# Patient Record
Sex: Female | Born: 1935
Health system: Southern US, Community
[De-identification: ages and names within clinical notes are randomized; demographics above are authoritative.]

## PROBLEM LIST (undated history)

## (undated) DIAGNOSIS — F429 Obsessive-compulsive disorder, unspecified: Secondary | ICD-10-CM

## (undated) DIAGNOSIS — E039 Hypothyroidism, unspecified: Secondary | ICD-10-CM

## (undated) DIAGNOSIS — R110 Nausea: Secondary | ICD-10-CM

## (undated) DIAGNOSIS — B029 Zoster without complications: Secondary | ICD-10-CM

## (undated) DIAGNOSIS — E079 Disorder of thyroid, unspecified: Secondary | ICD-10-CM

## (undated) DIAGNOSIS — G20 Parkinson's disease: Secondary | ICD-10-CM

## (undated) DIAGNOSIS — G20A1 Parkinson's disease without dyskinesia, without mention of fluctuations: Secondary | ICD-10-CM

## (undated) DIAGNOSIS — K8020 Calculus of gallbladder without cholecystitis without obstruction: Secondary | ICD-10-CM

## (undated) HISTORY — PX: OTHER SURGICAL HISTORY: SHX169

## (undated) HISTORY — DX: Disorder of thyroid, unspecified: E07.9

## (undated) HISTORY — DX: Zoster without complications: B02.9

## (undated) HISTORY — PX: THYROIDECTOMY: SHX17

## (undated) HISTORY — PX: EYE SURGERY: SHX253

---
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
## 1999-03-12 ENCOUNTER — Encounter: Payer: Self-pay | Admitting: Family Medicine

## 1999-03-12 ENCOUNTER — Encounter: Admission: RE | Admit: 1999-03-12 | Discharge: 1999-03-12 | Payer: Self-pay | Admitting: Family Medicine

## 2000-05-09 ENCOUNTER — Encounter: Admission: RE | Admit: 2000-05-09 | Discharge: 2000-05-09 | Payer: Self-pay | Admitting: Family Medicine

## 2000-05-09 ENCOUNTER — Encounter: Payer: Self-pay | Admitting: Family Medicine

## 2000-05-20 ENCOUNTER — Encounter: Admission: RE | Admit: 2000-05-20 | Discharge: 2000-05-20 | Payer: Self-pay | Admitting: Family Medicine

## 2000-05-20 ENCOUNTER — Encounter: Payer: Self-pay | Admitting: Family Medicine

## 2005-01-16 ENCOUNTER — Emergency Department (HOSPITAL_COMMUNITY): Admission: EM | Admit: 2005-01-16 | Discharge: 2005-01-16 | Payer: Self-pay | Admitting: Emergency Medicine

## 2008-03-09 ENCOUNTER — Ambulatory Visit: Payer: Self-pay | Admitting: Diagnostic Radiology

## 2008-03-09 ENCOUNTER — Encounter: Payer: Self-pay | Admitting: Emergency Medicine

## 2008-03-09 ENCOUNTER — Inpatient Hospital Stay (HOSPITAL_COMMUNITY): Admission: AD | Admit: 2008-03-09 | Discharge: 2008-03-10 | Payer: Self-pay | Admitting: Internal Medicine

## 2008-03-10 ENCOUNTER — Encounter (INDEPENDENT_AMBULATORY_CARE_PROVIDER_SITE_OTHER): Payer: Self-pay | Admitting: Internal Medicine

## 2008-03-10 ENCOUNTER — Ambulatory Visit: Payer: Self-pay | Admitting: Surgery

## 2009-07-26 ENCOUNTER — Encounter: Admission: RE | Admit: 2009-07-26 | Discharge: 2009-07-26 | Payer: Self-pay | Admitting: Family Medicine

## 2010-04-19 LAB — BASIC METABOLIC PANEL
CO2: 28 mEq/L (ref 19–32)
Calcium: 9.1 mg/dL (ref 8.4–10.5)
Creatinine, Ser: 0.7 mg/dL (ref 0.4–1.2)
GFR calc Af Amer: >60 mL/min (ref >60)

## 2010-04-19 LAB — CBC
HCT: 39.1 % (ref 36.0–46.0)
Hemoglobin: 13.2 g/dL (ref 12.0–15.0)
MCHC: 32.6 g/dL (ref 30.0–36.0)
MCHC: 33.7 g/dL (ref 30.0–36.0)
MCV: 91.7 fL (ref 78.0–100.0)
RBC: 4.26 MIL/uL (ref 3.87–5.11)
RBC: 4.62 MIL/uL (ref 3.87–5.11)
RDW: 12.7 % (ref 11.5–15.5)

## 2010-04-19 LAB — POCT CARDIAC MARKERS
CKMB, poc: <1 ng/mL — ABNORMAL LOW (ref 1.0–8.0)
Myoglobin, poc: 50.7 ng/mL (ref 12–200)

## 2010-04-19 LAB — COMPREHENSIVE METABOLIC PANEL
CO2: 25 mEq/L (ref 19–32)
Calcium: 8.7 mg/dL (ref 8.4–10.5)
Creatinine, Ser: 0.81 mg/dL (ref 0.4–1.2)
GFR calc non Af Amer: >60 mL/min (ref >60)
Glucose, Bld: 98 mg/dL (ref 70–99)
Total Bilirubin: 0.8 mg/dL (ref 0.3–1.2)

## 2010-04-19 LAB — DIFFERENTIAL
Basophils Absolute: 0 10*3/uL (ref 0.0–0.1)
Basophils Relative: 1 % (ref 0–1)
Monocytes Relative: 8 % (ref 3–12)
Neutro Abs: 4.2 10*3/uL (ref 1.7–7.7)
Neutrophils Relative %: 49 % (ref 43–77)

## 2010-04-19 LAB — HEMOGLOBIN A1C: Hgb A1c MFr Bld: 5.9 % (ref 4.6–6.1)

## 2010-04-19 LAB — HOMOCYSTEINE: Homocysteine: 8.4 umol/L (ref 4.0–15.4)

## 2010-04-19 LAB — CARDIAC PANEL(CRET KIN+CKTOT+MB+TROPI): CK, MB: 1.1 ng/mL (ref 0.3–4.0)

## 2010-04-19 LAB — TSH: TSH: 2.131 u[IU]/mL (ref 0.350–4.500)

## 2010-05-22 NOTE — Consult Note (Signed)
NAMEMARCEE, Reed                ACCOUNT NO.:  [id number]   MEDICAL RECORD NO.:  [id number]          PATIENT TYPE:  INP   LOCATION:  1442                         FACILITY:  [facility]   PHYSICIAN:  Deanna Artis. Hickling, M.D.DATE OF BIRTH:  Apr 23, 1926   DATE OF CONSULTATION:  03/10/2008  DATE OF DISCHARGE:                                 CONSULTATION   Courtney Reed is a 77 year old woman who was seen in consultation at the  request of Dr. Therisa Doyne a hospitalist for [facility].  The patient  has a chief complaint of seconds of blurred vision (graining of her  vision), unsteadiness on her feet, and slight right facial numbness.  This literally passed in seconds.  She had gotten up, bent on over to  fill up a bucket of hot water, and was walking to the shower when she  suddenly experienced her symptoms.  The symptoms cleared within seconds  other than the facial numbness which may have persisted a short time  longer.  The patient did not have any nausea, vomiting, headache, nor  did she have any other neurologic symptoms.   The patient was admitted to the hospital for evaluation of a possible  TIA.  Her workup shows evidence of a remote pair of strokes in the right  centrum semiovale.  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  I was asked to see her to  determine the etiology of her neurologic symptoms as well as to give an  opinion as to whether or not further diagnostic or therapeutic  intervention was needed for her aneurysm.   The patient has very few risk factors for stroke.  She has had transient  hypertension in the past.  She does not have diabetes.  I do not know if  she has dyslipidemia, but she has not had problems  with it in the past  and is not on statin drugs.  She has some osteoarthritis of her back and  from time to time has enough pain that she requires 200 mg of Advil to  improve her pain.   REVIEW OF SYSTEMS:  GENERAL:  The patient's appetite is good.  She has  normal sleep cycle and no significant weight gain.  HEAD/NECK:  No  otitis media, pharyngitis, or sinusitis.  PULMONARY:  No asthma,  bronchitis, or pneumonia.  CARDIOVASCULAR:  No palpitations, chest pain,  dyspnea on exertion, or prior myocardial infarction.  Marland Kitchen  GASTROINTESTINAL:  No nausea, vomiting, diarrhea, or constipation.  GENITOURINARY:  No urinary tract infection or hematuria.  MUSCULOSKELETAL:  No fractures or deformities.  She does have  osteoarthritis.  ENDOCRINE:  No diabetes or thyroid disease.  ALLERGY/IMMUNOLOGY:  No known environmental allergies.  NEUROPSYCHIATRIC:  No depression or anxiety.  NEUROLOGIC:  No  dysarthria, dysphagia, tinnitus, syncope, vertigo, weakness, seizures,  or loss  of bowel and bladder control.  A 12 system review is otherwise  negative.   CURRENT MEDICATIONS:  1. Aspirin 81 mg daily.  2. Synthroid.  3. Premarin.  4. Calcium.  5. Multivitamins.   DRUG ALLERGIES:  SULFA.   FAMILY HISTORY:  Positive for stroke in her mother which occurred in her  51s.   SOCIAL HISTORY:  The patient does not use tobacco or alcohol.  She is a  retired [employer] operational officer.  She graduated from high school.  Her husband died in  06/02/2004 after a 3-year illness following stroke.   PHYSICAL EXAMINATION:  GENERAL:  On examination today, this is a well-  developed, well-nourished, pleasant, right-handed woman in no distress.  VITAL SIGNS:  Blood pressure 107/56, resting pulse 67, respirations 18,  temperature 97.8.  HEAD, EYES, EARS, NOSE, AND THROAT:  No signs of infection.  Supple neck  full range of motion.  No cranial or cervical bruits.  LUNGS:  Clear to auscultation.  HEART:  No murmurs.  Pulses normal.  ABDOMEN:   Soft, nontender.  Bowel sounds normal.  EXTREMITIES:  Well-formed without edema, cyanosis, alterations in tone.  NEUROLOGIC:  Mental status:  Awake, alert, attentive, appropriate, no  dysphasia, dyspraxia, names objects, follows commands, conveys thoughts  and feelings.  Cranial nerves:  Round and reactive pupils.  Normal  fundi.  Visual fields full to double simultaneous stimuli.  Extraocular  movements full and conjugate.  Symmetric facial strength.  Midline  tongue and uvula.  Air conduction greater than bone conduction  bilaterally.  Motor examination:  Normal strength, tone, and mass.  Good  fine motor movements.  No pronator drift.  Sensation shows a mild  peripheral polyneuropathy to cold.  She has intact responses.  She has  diminished vibration.  Normal proprioception.  Good stereoagnosis.  Gait  was normal.  She was able to walk on her heels and toes performing  tandem without difficulty.  Romberg is negative.  Deep tendon reflexes  were diminished.  The patient had bilateral flexor plantar responses.   IMPRESSION:  Vertebrobasilar insufficiency 435.1.  I really think that  this was a mild episode of hypotension and hypoperfusion rather than an  embolus or thrombus.  The episode lasted a very short period of time,  too short in my opinion to be a true transient ischemic attack.   Nonetheless, the patient has had 2 white matter infarctions in the right  centrum semiovale.  These are remote.  There are no acute infarctions.  Thus, she needs a workup for treatable causes of stroke.   She has a 2.8 to 3.2 mm aneurysm of the carotid artery just distal to  the ophthalmic takeoff.  The risk of rupture of this is 1% per year  which makes it a condition that should be observed without treatment.   I think it would be worthwhile to perform an MRA of the brain in one  year's time to see if the aneurysm has changed.  If it has, referral to  Neurosurgery would be indicated.  The risks of  coiling this are  relatively small, but they are bigger and more immediate than  conservatively following this for now.   We need to deal with any risk factors that she has for stroke given that  she has had 2 strokes even though I do not believe this time was a  stroke.   I discussed this with Dr. Adela Glimpse and also with the patient.  They are  in agreement.  I appreciate the opportunity to participate in her care.      Deanna Artis. Sharene Skeans, M.D.  Electronically Signed     WHH/MEDQ  D:  03/10/2008  T:  03/10/2008  Job:  [id number]   cc:   L. Lupe Carney, M.D.  Fax: [phone]

## 2010-05-22 NOTE — H&P (Signed)
Courtney Reed, HARADA                ACCOUNT NO.:  [id number]   MEDICAL RECORD NO.:  [id number]          PATIENT TYPE:  INP   LOCATION:  1442                         FACILITY:  [facility]   PHYSICIAN:  Michiel Cowboy, MDDATE OF BIRTH:  1926-12-06   DATE OF ADMISSION:  03/09/2008  DATE OF DISCHARGE:                              HISTORY & PHYSICAL   PRIMARY CARE Ved Martos:  Dr. Lupe Carney.   CHIEF COMPLAINT:  Unstable gait, blurred vision.  The patient is a 77-  year-old female with past medical history of hypothyroidism who was at  her baseline of health up until this morning when she got up to use the  shower she noticed that she was somewhat unsteady on her feet which is a  new thing for her.  She is usually able to ambulate just without any  troubles.  She also notes some blurred vision and slight tingling over  the right side of her face.  No localized loss of strength.  No chest  pain.  The patient has been generally short of breath for quite some  time, and her physician felt that it was secondary to deconditioning.  Otherwise, no fevers no chills.   REVIEW OF SYSTEMS:  Otherwise unremarkable.   PAST MEDICAL HISTORY:  Only significant for hypothyroidism.   SOCIAL HISTORY:  The patient does not smoke or drink.  Lives at home by  herself.   FAMILY HISTORY:  Noncontributory.   ALLERGIES:  SULFA   MEDICATIONS:  1. Aspirin 81 mg p.o. daily.  Of note, the patient did take her daily      aspirin today plus 2 doses once she noticed the symptoms.  2. Multivitamins.  3. Calcium.  4. Synthroid.  5. Premarin twice a day.   She is unsure what dose of her medications.   PHYSICAL EXAMINATION:  VITALS:  Temperature 98.2, blood pressure 167/82,  pulse 77, respirations 18, saturating 99% on room air.  The patient  currently appears to be in no acute distress, much younger than stated  age.  HEAD:  Nontraumatic.  Moist mucous membranes.  LUNGS:  Clear to auscultation  bilaterally.  HEART:  Regular rate and rhythm.  No murmurs, rubs or gallops.  ABDOMEN:  Soft, nontender, nondistended.  EXTREMITIES:  Without clubbing, cyanosis or edema.  The patient does have some ataxia when walks but no loss of strength.  Strength intact bilaterally 5/5.  Reflexes normal.  Cranial nerves II-  XII intact.   LABORATORY DATA:  White blood cell count 8.4, hemoglobin 13.8.  Sodium  142, potassium 4.0, creatinine 0.7.  Cardiac enzymes negative.  CT scan  of the head unremarkable.   EKG showing occasional T-wave flattening but no ischemic changes noted.  No old EKG available.  There is T-wave flattening in lead V2, V3, and  perhaps some aVL.   ASSESSMENT AND PLAN:  This is a 77 year old female with past medical  history of hypothyroidism who was at her baseline of health until this  morning when she developed symptoms consistent with possible TIA;  currently is almost all resolved.  We  will admit to telemetry floor.  Will do an MRI/MRA of the brain.  Obtain 2-D echocardiogram, carotid  Dopplers.  Consider neurology consult tomorrow.   Will also check a fasting lipid panel, homocystine level.  We will avoid  over treatment of high blood pressure while potentially has a TIA but  will give p.r.n. hydralazine for severe hypertension.   History of hypothyroidism:  Will check TSH.  Continue Synthroid.  The  patient thinks it may be 75 mcg per day.  Will try to obtain accurate  current medication list from family.   Prophylaxis:  Protonix plus SCDs.      Michiel Cowboy, MD  Electronically Signed     AVD/MEDQ  D:  03/09/2008  T:  03/09/2008  Job:  [id number]   cc:   L. Lupe Carney, M.D.  Fax: [phone]

## 2010-05-22 NOTE — Discharge Summary (Signed)
Courtney Reed, Courtney Reed                ACCOUNT NO.:  [id number]   MEDICAL RECORD NO.:  [id number]          PATIENT TYPE:  INP   LOCATION:  1442                         [facility]AC[facility]  [facility]   PHYSICIAN:  Michiel Cowboy, MDDATE OF BIRTH:  11-Jul-1926   DATE OF ADMISSION:  03/09/2008  DATE OF DISCHARGE:  03/10/2008                               DISCHARGE SUMMARY   PRIMARY CARE Amarilys Lyles:  Elsworth Soho, M.D.   CONSULTANT:  Deanna Artis. Sharene Skeans, M.D.   DISCHARGE DIAGNOSES:  1. Transient neurological change thought to be likely secondary to      transient episode of hypertension/hypoperfusion rather than true      transient ischemic attack.  2. Newly diagnosed 3-mm by 3.2-mm aneurysm of carotid artery just      distal to a prominent takeoff.  3. MRI findings consistent with old cerebrovascular accidents in the      past.   The patient is a 77 year old female who presented with chief complaint  of transient ataxia and slight tingling of the right side of her face  which was very brief and resolved by the time she was transferred from  [facility] to [facility], but the patient was still being evaluated for  possible transient TIA, so for possible TIA Dr. Sharene Skeans was called to  see her in consult.  Per his recommendations, he thought that this was  not necessarily a true TIA but probably a hypoperfusion episode due to  possible transient hypertension since this was a very brief event.  Per  Dr. Sharene Skeans, will continue aspirin.  She was also noted to have, on her  MRI, a 3-mm x 3.2-mm aneurysm of carotid artery just distal to a  prominent takeoff.  Per Dr. Sharene Skeans, the risk of this rupturing is  about 1% per year.  They discussed this with the patient and came to  conclusion for now will be managed medically conservatively and just  with observation.  She will probably need a repeat MRA within a year and  I asked her to see Dr. Sharene Skeans within a year for follow-up.  If there  is a growth  noted, then possibly referral to neurosurgery would be  warranted.   History of hypothyroidism, continue Synthroid.   LABS:  The patient's cardiac markers were unremarkable.  TSH was  unremarkable.  Her carotid dopplers, preliminary study, did not show any  evidence of significant obstruction.  A 2-D echocardiogram was done and  apparently not back.  Homocysteine level was within normal limits.  Fasting lipid panel is still currently pending.  Hemoglobin A1c was 5.9.   The patient wished to be discharged to home and will do so with close  follow-up with her primary care Imagene Boss and continued modification of  her risk factors.  She was transiently hypertensive up to 167 while  here, but her blood pressure has come down without any intervention and  remained low 100 throughout the hospitalization; at this point, no  antihypertensive initiated.   DISCHARGE MEDICATIONS:  1. Continue home aspirin 81 mg p.o. daily.  2. Continue multivitamin.  3.  Continue calcium and Synthroid at 75 mcg p.o. daily.   FOLLOWUP:  Patient is to follow up with her primary care Quaniya Damas, Dr.  Clovis Riley, in 1-2 weeks and with Dr. Sharene Skeans in a year.  Patient to have  a repeat MRA of her brain in a year.      Michiel Cowboy, MD  Electronically Signed     AVD/MEDQ  D:  03/10/2008  T:  03/10/2008  Job:  [id number]   cc:   L. Lupe Carney, M.D.  Fax: [phone]   Deanna Artis. Sharene Skeans, M.D.  Fax: [phone]

## 2011-02-05 DIAGNOSIS — H02839 Dermatochalasis of unspecified eye, unspecified eyelid: Secondary | ICD-10-CM | POA: Diagnosis not present

## 2011-02-19 DIAGNOSIS — M159 Polyosteoarthritis, unspecified: Secondary | ICD-10-CM | POA: Diagnosis not present

## 2011-02-19 DIAGNOSIS — Z79899 Other long term (current) drug therapy: Secondary | ICD-10-CM | POA: Diagnosis not present

## 2011-02-19 DIAGNOSIS — N951 Menopausal and female climacteric states: Secondary | ICD-10-CM | POA: Diagnosis not present

## 2011-02-19 DIAGNOSIS — G20 Parkinson's disease: Secondary | ICD-10-CM | POA: Diagnosis not present

## 2011-02-19 DIAGNOSIS — E039 Hypothyroidism, unspecified: Secondary | ICD-10-CM | POA: Diagnosis not present

## 2011-02-19 DIAGNOSIS — I671 Cerebral aneurysm, nonruptured: Secondary | ICD-10-CM | POA: Diagnosis not present

## 2011-02-22 DIAGNOSIS — D485 Neoplasm of uncertain behavior of skin: Secondary | ICD-10-CM | POA: Diagnosis not present

## 2011-02-22 DIAGNOSIS — R209 Unspecified disturbances of skin sensation: Secondary | ICD-10-CM | POA: Diagnosis not present

## 2011-03-19 DIAGNOSIS — G20 Parkinson's disease: Secondary | ICD-10-CM | POA: Diagnosis not present

## 2011-04-30 ENCOUNTER — Encounter: Payer: Self-pay | Admitting: *Deleted

## 2011-05-07 DIAGNOSIS — R609 Edema, unspecified: Secondary | ICD-10-CM | POA: Diagnosis not present

## 2011-05-07 DIAGNOSIS — M546 Pain in thoracic spine: Secondary | ICD-10-CM | POA: Diagnosis not present

## 2011-05-07 DIAGNOSIS — R1011 Right upper quadrant pain: Secondary | ICD-10-CM | POA: Diagnosis not present

## 2011-05-15 DIAGNOSIS — R1011 Right upper quadrant pain: Secondary | ICD-10-CM | POA: Diagnosis not present

## 2011-06-04 ENCOUNTER — Encounter (INDEPENDENT_AMBULATORY_CARE_PROVIDER_SITE_OTHER): Payer: Self-pay | Admitting: Surgery

## 2011-06-04 ENCOUNTER — Ambulatory Visit (INDEPENDENT_AMBULATORY_CARE_PROVIDER_SITE_OTHER): Payer: Medicare Other | Admitting: Surgery

## 2011-06-04 VITALS — BP 146/86 | HR 78 | Temp 98.3°F | Resp 14 | Ht 61.0 in | Wt 134.8 lb

## 2011-06-04 DIAGNOSIS — K8020 Calculus of gallbladder without cholecystitis without obstruction: Secondary | ICD-10-CM

## 2011-06-04 NOTE — Patient Instructions (Signed)
Laparoscopic Cholecystectomy Laparoscopic cholecystectomy is surgery to remove the gallbladder. The gallbladder is located slightly to the right of center in the abdomen, behind the liver. It is a concentrating and storage sac for the bile produced in the liver. Bile aids in the digestion and absorption of fats. Gallbladder disease (cholecystitis) is an inflammation of your gallbladder. This condition is usually caused by a buildup of gallstones (cholelithiasis) in your gallbladder. Gallstones can block the flow of bile, resulting in inflammation and pain. In severe cases, emergency surgery may be required. When emergency surgery is not required, you will have time to prepare for the procedure. Laparoscopic surgery is an alternative to open surgery. Laparoscopic surgery usually has a shorter recovery time. Your common bile duct may also need to be examined and explored. Your caregiver will discuss this with you if he or she feels this should be done. If stones are found in the common bile duct, they may be removed. LET YOUR CAREGIVER KNOW ABOUT:  Allergies to food or medicine.   Medicines taken, including vitamins, herbs, eyedrops, over-the-counter medicines, and creams.   Use of steroids (by mouth or creams).   Previous problems with anesthetics or numbing medicines.   History of bleeding problems or blood clots.   Previous surgery.   Other health problems, including diabetes and kidney problems.   Possibility of pregnancy, if this applies.  RISKS AND COMPLICATIONS All surgery is associated with risks. Some problems that may occur following this procedure include:  Infection.   Damage to the common bile duct, nerves, arteries, veins, or other internal organs such as the stomach or intestines.   Bleeding.   A stone may remain in the common bile duct.  BEFORE THE PROCEDURE  Do not take aspirin for 3 days prior to surgery or blood thinners for 1 week prior to surgery.   Do not eat or  drink anything after midnight the night before surgery.   Let your caregiver know if you develop a cold or other infectious problem prior to surgery.   You should be present 60 minutes before the procedure or as directed.  PROCEDURE  You will be given medicine that makes you sleep (general anesthetic). When you are asleep, your surgeon will make several small cuts (incisions) in your abdomen. One of these incisions is used to insert a small, lighted scope (laparoscope) into the abdomen. The laparoscope helps the surgeon see into your abdomen. Carbon dioxide gas will be pumped into your abdomen. The gas allows more room for the surgeon to perform your surgery. Other operating instruments are inserted through the other incisions. Laparoscopic procedures may not be appropriate when:  There is major scarring from previous surgery.   The gallbladder is extremely inflamed.   There are bleeding disorders or unexpected cirrhosis of the liver.   A pregnancy is near term.   Other conditions make the laparoscopic procedure impossible.  If your surgeon feels it is not safe to continue with a laparoscopic procedure, he or she will perform an open abdominal procedure. In this case, the surgeon will make an incision to open the abdomen. This gives the surgeon a larger view and field to work within. This may allow the surgeon to perform procedures that sometimes cannot be performed with a laparoscope alone. Open surgery has a longer recovery time. AFTER THE PROCEDURE  You will be taken to the recovery area where a nurse will watch and check your progress.   You may be allowed to go home   the same day.   Do not resume physical activities until directed by your caregiver.   You may resume a normal diet and activities as directed.  Document Released: 12/24/2004 Document Revised: 12/13/2010 Document Reviewed: 06/08/2010 [facility] Patient Information 2012 [place], [place].  Cholelithiasis Cholelithiasis  (also called gallstones) is a form of gallbladder disease where gallstones form in your gallbladder. The gallbladder is a non-essential organ that stores bile made in the liver, which helps digest fats. Gallstones begin as small crystals and slowly grow into stones. Gallstone pain occurs when the gallbladder spasms, and a gallstone is blocking the duct. Pain can also occur when a stone passes out of the duct.  Women are more likely to develop gallstones than men. Other factors that increase the risk of gallbladder disease are:  Having multiple pregnancies. Physicians sometimes advise removing diseased gallbladders before future pregnancies.   Obesity.   Diets heavy in fried foods and fat.   Increasing age (older than 23).   Prolonged use of medications containing female hormones.   Diabetes mellitus.   Rapid weight loss.   Family history of gallstones (heredity).  SYMPTOMS  Feeling sick to your stomach (nauseous).   Abdominal pain.   Yellowing of the skin (jaundice).   Sudden pain. It may persist from several minutes to several hours.   Worsening pain with deep breathing or when jarred.   Fever.   Tenderness to the touch.  In some cases, when gallstones do not move into the bile duct, people have no pain or symptoms. These are called "silent" gallstones. TREATMENT In severe cases, emergency surgery may be required. HOME CARE INSTRUCTIONS   Only take over-the-counter or prescription medicines for pain, discomfort, or fever as directed by your caregiver.   Follow a low-fat diet until seen again. Fat causes the gallbladder to contract, which can result in pain.   Follow up as instructed. Attacks are almost always recurrent and surgery is usually required for permanent treatment.  SEEK IMMEDIATE MEDICAL CARE IF:   Your pain increases and is not controlled by medications.   You have an oral temperature above 102 F (38.9 C), not controlled by medication.   You develop  nausea and vomiting.  MAKE SURE YOU:   Understand these instructions.   Will watch your condition.   Will get help right away if you are not doing well or get worse.  Document Released: 12/20/2004 Document Revised: 12/13/2010 Document Reviewed: 02/22/2010 [facility] Patient Information 2012 [place], [place].

## 2011-06-04 NOTE — Progress Notes (Signed)
Patient ID: Courtney Reed, female   DOB: 04/03/1926, 78 y.o.   MRN: [id number]  Chief Complaint  Patient presents with  . New Evaluation    New GB with Stones    HPI Courtney Reed is a 78 y.o. female.   HPIPatient seen today at the request of Dr. Mitchell due to nausea, vomiting and right upper quadrant abdominal pain for 2 months. She has a history of a gallstone since 1983. Over the last 2 months she has developed more nausea and right upper quadrant pain. She has Parkinson's disease. She was trying to medication which causes nausea and initially thought it was secondary to that. She's having more right upper quadrant pain especially with eating. No weight loss. Her Parkinson's is early.  Past Medical History  Diagnosis Date  . Thyroid disease     Past Surgical History  Procedure Date  . Thyroidectomy   . Hysterecomy - unknown type   . Appendectomy 1950    Family History  Problem Relation Age of Onset  . Heart attack    . Cancer    . Coronary artery disease    . Heart disease Father     Heart attack    Social History History  Substance Use Topics  . Smoking status: Never Smoker   . Smokeless tobacco: Not on file  . Alcohol Use: No    Allergies  Allergen Reactions  . Sulfa Antibiotics     Current Outpatient Prescriptions  Medication Sig Dispense Refill  . aspirin 81 MG tablet Take 81 mg by mouth daily.      . BIOTIN PO Take by mouth daily.      . Calcium-Magnesium-Zinc (CALCIUM-MAGNESUIUM-ZINC PO) Take by mouth daily.      . estrogens, conjugated, (PREMARIN) 0.625 MG tablet Take 0.625 mg by mouth 2 (two) times a week. .      . levothyroxine (SYNTHROID, LEVOTHROID) 75 MCG tablet Take 75 mcg by mouth daily.      . Multiple Vitamin (MULTIVITAMIN) tablet Take 1 tablet by mouth daily.      . rOPINIRole (REQUIP) 1 MG tablet         Review of Systems Review of Systems  Constitutional: Positive for appetite change. Negative for chills.  HENT: Negative.   Eyes:  Negative.   Respiratory: Negative.   Cardiovascular: Negative.   Gastrointestinal: Positive for nausea, vomiting and abdominal pain.  Genitourinary: Negative.   Musculoskeletal: Positive for gait problem.  Neurological: Positive for tremors.  Hematological: Negative.   Psychiatric/Behavioral: Negative.     Blood pressure 146/86, pulse 78, temperature 98.3 F (36.8 C), temperature source Temporal, resp. rate 14, height 5' 1" (1.549 m), weight 134 lb 12.8 oz (61.145 kg).  Physical Exam Physical Exam  Constitutional: She is oriented to person, place, and time. She appears well-developed and well-nourished.  HENT:  Head: Normocephalic and atraumatic.  Eyes: EOM are normal. Pupils are equal, round, and reactive to light.  Neck: Normal range of motion. Neck supple.  Cardiovascular: Normal rate and regular rhythm.   Pulmonary/Chest: Effort normal and breath sounds normal.  Abdominal: Soft. Bowel sounds are normal. She exhibits no distension. There is no tenderness. There is no rebound.  Musculoskeletal: Normal range of motion.  Neurological: She is alert and oriented to person, place, and time.  Skin: Skin is warm and dry.  Psychiatric: She has a normal mood and affect. Her behavior is normal. Judgment and thought content normal.    Data Reviewed 3 cm   gallstone .  History of Murphy's sign  CBD normal Assessment    Cholelithiasis with biliary colic Past Medical History  Diagnosis Date  . Thyroid disease   Pakinson's Disease    Plan    Laparoscopic cholecystectomy and cholangiogram.  The procedure has been discussed with the patient. Operative and non operative treatments have been discussed. Risks of surgery include bleeding, infection,  Common bile duct injury,  Injury to the stomach,liver, colon,small intestine, abdominal wall,  Diaphragm,  Major blood vessels,  And the need for an open procedure.  Other risks include worsening of medical problems, death,  DVT and pulmonary  embolism, and cardiovascular events.   Medical options have also been discussed. The patient has been informed of long term expectations of surgery and non surgical options,  The patient agrees to proceed.         Dorn Hartshorne A. 06/04/2011, 2:25 PM    

## 2011-06-27 ENCOUNTER — Encounter (HOSPITAL_BASED_OUTPATIENT_CLINIC_OR_DEPARTMENT_OTHER): Payer: Self-pay | Admitting: *Deleted

## 2011-06-27 NOTE — Progress Notes (Signed)
Pt to come in for labs-cxr Bring over night bag and all meds

## 2011-06-28 ENCOUNTER — Ambulatory Visit
Admission: RE | Admit: 2011-06-28 | Discharge: 2011-06-28 | Disposition: A | Discharge status: Home / Self Care | Payer: Medicare Other | Source: Ambulatory Visit | Attending: Surgery | Admitting: Surgery

## 2011-06-28 ENCOUNTER — Encounter (HOSPITAL_BASED_OUTPATIENT_CLINIC_OR_DEPARTMENT_OTHER)
Admission: RE | Admit: 2011-06-28 | Discharge: 2011-06-28 | Disposition: A | Discharge status: Home / Self Care | Payer: Medicare Other | Source: Ambulatory Visit

## 2011-06-28 DIAGNOSIS — Z01810 Encounter for preprocedural cardiovascular examination: Secondary | ICD-10-CM | POA: Diagnosis not present

## 2011-06-28 DIAGNOSIS — G20 Parkinson's disease: Secondary | ICD-10-CM | POA: Diagnosis not present

## 2011-06-28 DIAGNOSIS — K8020 Calculus of gallbladder without cholecystitis without obstruction: Secondary | ICD-10-CM | POA: Diagnosis not present

## 2011-06-28 DIAGNOSIS — Z01811 Encounter for preprocedural respiratory examination: Secondary | ICD-10-CM | POA: Diagnosis not present

## 2011-06-28 DIAGNOSIS — K219 Gastro-esophageal reflux disease without esophagitis: Secondary | ICD-10-CM | POA: Diagnosis not present

## 2011-06-28 DIAGNOSIS — E039 Hypothyroidism, unspecified: Secondary | ICD-10-CM | POA: Diagnosis not present

## 2011-06-28 DIAGNOSIS — Z01812 Encounter for preprocedural laboratory examination: Secondary | ICD-10-CM | POA: Diagnosis not present

## 2011-06-28 LAB — COMPREHENSIVE METABOLIC PANEL
ALT: 17 U/L (ref 0–35)
AST: 22 U/L (ref 0–37)
CO2: 28 mEq/L (ref 19–32)
Chloride: 96 mEq/L (ref 96–112)
GFR calc non Af Amer: 81 mL/min — ABNORMAL LOW (ref >90)
Sodium: 133 mEq/L — ABNORMAL LOW (ref 135–145)
Total Bilirubin: 0.4 mg/dL (ref 0.3–1.2)

## 2011-06-28 LAB — DIFFERENTIAL
Basophils Absolute: 0 10*3/uL (ref 0.0–0.1)
Basophils Relative: 0 % (ref 0–1)
Eosinophils Relative: 2 % (ref 0–5)
Lymphocytes Relative: 43 % (ref 12–46)
Monocytes Absolute: 0.6 10*3/uL (ref 0.1–1.0)

## 2011-06-28 LAB — CBC
Platelets: 227 10*3/uL (ref 150–400)
RBC: 4.58 MIL/uL (ref 3.87–5.11)
WBC: 8 10*3/uL (ref 4.0–10.5)

## 2011-07-01 ENCOUNTER — Encounter (HOSPITAL_BASED_OUTPATIENT_CLINIC_OR_DEPARTMENT_OTHER): Payer: Self-pay | Admitting: Certified Registered Nurse Anesthetist

## 2011-07-01 ENCOUNTER — Ambulatory Visit (HOSPITAL_COMMUNITY): Payer: Medicare Other

## 2011-07-01 ENCOUNTER — Ambulatory Visit (HOSPITAL_BASED_OUTPATIENT_CLINIC_OR_DEPARTMENT_OTHER)
Admission: RE | Admit: 2011-07-01 | Discharge: 2011-07-02 | Disposition: A | Discharge status: Home / Self Care | Payer: Medicare Other | Source: Ambulatory Visit | Attending: Surgery | Admitting: Surgery

## 2011-07-01 ENCOUNTER — Ambulatory Visit (HOSPITAL_BASED_OUTPATIENT_CLINIC_OR_DEPARTMENT_OTHER): Payer: Medicare Other | Admitting: Certified Registered Nurse Anesthetist

## 2011-07-01 ENCOUNTER — Encounter (HOSPITAL_BASED_OUTPATIENT_CLINIC_OR_DEPARTMENT_OTHER): Payer: Self-pay | Admitting: *Deleted

## 2011-07-01 ENCOUNTER — Encounter (HOSPITAL_BASED_OUTPATIENT_CLINIC_OR_DEPARTMENT_OTHER)
Admission: RE | Disposition: A | Discharge status: Home / Self Care | Payer: Self-pay | Source: Ambulatory Visit | Attending: Surgery

## 2011-07-01 DIAGNOSIS — G20 Parkinson's disease: Secondary | ICD-10-CM | POA: Diagnosis not present

## 2011-07-01 DIAGNOSIS — E039 Hypothyroidism, unspecified: Secondary | ICD-10-CM | POA: Insufficient documentation

## 2011-07-01 DIAGNOSIS — K8010 Calculus of gallbladder with chronic cholecystitis without obstruction: Secondary | ICD-10-CM

## 2011-07-01 DIAGNOSIS — G20A1 Parkinson's disease without dyskinesia, without mention of fluctuations: Secondary | ICD-10-CM | POA: Insufficient documentation

## 2011-07-01 DIAGNOSIS — K219 Gastro-esophageal reflux disease without esophagitis: Secondary | ICD-10-CM | POA: Diagnosis not present

## 2011-07-01 DIAGNOSIS — Z01810 Encounter for preprocedural cardiovascular examination: Secondary | ICD-10-CM | POA: Insufficient documentation

## 2011-07-01 DIAGNOSIS — Z01812 Encounter for preprocedural laboratory examination: Secondary | ICD-10-CM | POA: Insufficient documentation

## 2011-07-01 DIAGNOSIS — K8020 Calculus of gallbladder without cholecystitis without obstruction: Secondary | ICD-10-CM | POA: Insufficient documentation

## 2011-07-01 DIAGNOSIS — Z9889 Other specified postprocedural states: Secondary | ICD-10-CM

## 2011-07-01 HISTORY — DX: Parkinson's disease: G20

## 2011-07-01 HISTORY — DX: Calculus of gallbladder without cholecystitis without obstruction: K80.20

## 2011-07-01 HISTORY — DX: Parkinson's disease without dyskinesia, without mention of fluctuations: G20.A1

## 2011-07-01 HISTORY — DX: Hypothyroidism, unspecified: E03.9

## 2011-07-01 HISTORY — PX: CHOLECYSTECTOMY: SHX55

## 2011-07-01 HISTORY — DX: Nausea: R11.0

## 2011-07-01 SURGERY — LAPAROSCOPIC CHOLECYSTECTOMY WITH INTRAOPERATIVE CHOLANGIOGRAM
Anesthesia: General | Site: Abdomen | Wound class: Clean Contaminated

## 2011-07-01 MED ORDER — SODIUM CHLORIDE 0.9 % IV SOLN
INTRAVENOUS | Status: DC | PRN
  Administered 2011-07-01: 14:00:00

## 2011-07-01 MED ORDER — GLYCOPYRROLATE 0.2 MG/ML IJ SOLN
INTRAMUSCULAR | Status: DC | PRN
  Administered 2011-07-01: 0.2 mg via INTRAVENOUS
  Administered 2011-07-01: .5 mg via INTRAVENOUS

## 2011-07-01 MED ORDER — LIDOCAINE HCL (CARDIAC) 20 MG/ML IV SOLN
INTRAVENOUS | Status: DC | PRN
  Administered 2011-07-01: 50 mg via INTRAVENOUS

## 2011-07-01 MED ORDER — BUPIVACAINE-EPINEPHRINE 0.25% -1:200000 IJ SOLN
INTRAMUSCULAR | Status: DC | PRN
  Administered 2011-07-01: 6 mL

## 2011-07-01 MED ORDER — ACETAMINOPHEN 650 MG RE SUPP: 650.0000 mg | RECTAL | Status: DC | PRN

## 2011-07-01 MED ORDER — LEVOTHYROXINE SODIUM 75 MCG PO TABS
75.0000 ug | ORAL_TABLET | Freq: Every day | ORAL | Status: DC
  Administered 2011-07-02: 75 ug via ORAL

## 2011-07-01 MED ORDER — LACTATED RINGERS IV SOLN
INTRAVENOUS | Status: DC
  Administered 2011-07-01: 20 mL/h via INTRAVENOUS
  Administered 2011-07-01: 14:00:00 via INTRAVENOUS

## 2011-07-01 MED ORDER — NEOSTIGMINE METHYLSULFATE 1 MG/ML IJ SOLN
INTRAMUSCULAR | Status: DC | PRN
  Administered 2011-07-01: 3.5 mg via INTRAVENOUS

## 2011-07-01 MED ORDER — FENTANYL CITRATE 0.05 MG/ML IJ SOLN
INTRAMUSCULAR | Status: DC | PRN
  Administered 2011-07-01 (×2): 50 ug via INTRAVENOUS

## 2011-07-01 MED ORDER — ONDANSETRON HCL 4 MG/2ML IJ SOLN
INTRAMUSCULAR | Status: DC | PRN
  Administered 2011-07-01: 4 mg via INTRAVENOUS

## 2011-07-01 MED ORDER — DEXAMETHASONE SODIUM PHOSPHATE 4 MG/ML IJ SOLN
INTRAMUSCULAR | Status: DC | PRN
  Administered 2011-07-01: 10 mg via INTRAVENOUS

## 2011-07-01 MED ORDER — HYDROMORPHONE HCL PF 1 MG/ML IJ SOLN
0.2500 mg | INTRAMUSCULAR | Status: DC | PRN
  Administered 2011-07-01 (×2): 0.25 mg via INTRAVENOUS

## 2011-07-01 MED ORDER — MORPHINE SULFATE 2 MG/ML IJ SOLN: 1.0000 mg | INTRAMUSCULAR | Status: DC | PRN

## 2011-07-01 MED ORDER — MIDAZOLAM HCL 2 MG/2ML IJ SOLN: 0.5000 mg | Freq: Once | INTRAMUSCULAR | Status: DC | PRN

## 2011-07-01 MED ORDER — DEXTROSE-NACL 5-0.45 % IV SOLN
INTRAVENOUS | Status: DC
  Administered 2011-07-01: 16:00:00 via INTRAVENOUS

## 2011-07-01 MED ORDER — ACETAMINOPHEN 325 MG PO TABS
650.0000 mg | ORAL_TABLET | ORAL | Status: DC | PRN
  Administered 2011-07-02: 650 mg via ORAL

## 2011-07-01 MED ORDER — SODIUM CHLORIDE 0.9 % IR SOLN
Status: DC | PRN
  Administered 2011-07-01: 1000 mL

## 2011-07-01 MED ORDER — OXYCODONE-ACETAMINOPHEN 5-325 MG PO TABS: 1.0000 | ORAL_TABLET | ORAL | Status: AC | PRN

## 2011-07-01 MED ORDER — OXYCODONE HCL 5 MG PO TABS
5.0000 mg | ORAL_TABLET | ORAL | Status: DC | PRN
  Administered 2011-07-01: 10 mg via ORAL

## 2011-07-01 MED ORDER — ROPINIROLE HCL 1 MG PO TABS: 2.0000 mg | ORAL_TABLET | Freq: Three times a day (TID) | ORAL | Status: DC

## 2011-07-01 MED ORDER — PROMETHAZINE HCL 25 MG/ML IJ SOLN: 6.2500 mg | INTRAMUSCULAR | Status: DC | PRN

## 2011-07-01 MED ORDER — CEFAZOLIN SODIUM-DEXTROSE 2-3 GM-% IV SOLR
2.0000 g | INTRAVENOUS | Status: AC
  Administered 2011-07-01: 2 g via INTRAVENOUS

## 2011-07-01 MED ORDER — ONDANSETRON HCL 4 MG/2ML IJ SOLN: 4.0000 mg | Freq: Four times a day (QID) | INTRAMUSCULAR | Status: DC | PRN

## 2011-07-01 MED ORDER — PROPOFOL 10 MG/ML IV EMUL
INTRAVENOUS | Status: DC | PRN
  Administered 2011-07-01: 100 mg via INTRAVENOUS

## 2011-07-01 MED ORDER — MEPERIDINE HCL 25 MG/ML IJ SOLN: 6.2500 mg | INTRAMUSCULAR | Status: DC | PRN

## 2011-07-01 MED ORDER — ROCURONIUM BROMIDE 100 MG/10ML IV SOLN
INTRAVENOUS | Status: DC | PRN
  Administered 2011-07-01: 40 mg via INTRAVENOUS

## 2011-07-01 SURGICAL SUPPLY — 48 items
ADH SKN CLS APL DERMABOND .7 (GAUZE/BANDAGES/DRESSINGS) ×1
APPLIER CLIP 5 13 M/L LIGAMAX5 (MISCELLANEOUS) ×4
APPLIER CLIP ROT 10 11.4 M/L (STAPLE)
APR CLP MED LRG 11.4X10 (STAPLE)
APR CLP MED LRG 5 ANG JAW (MISCELLANEOUS) ×2
BAG SPEC RTRVL LRG 6X4 10 (ENDOMECHANICALS) ×1
BLADE SURG ROTATE 9660 (MISCELLANEOUS) IMPLANT
CANISTER LINER 1300 C W/ELBOW (MISCELLANEOUS) ×2 IMPLANT
CANISTER SUCTION 2500CC (MISCELLANEOUS) ×1 IMPLANT
CHLORAPREP W/TINT 26ML (MISCELLANEOUS) ×2 IMPLANT
CLIP APPLIE 5 13 M/L LIGAMAX5 (MISCELLANEOUS) IMPLANT
CLIP APPLIE ROT 10 11.4 M/L (STAPLE) ×1 IMPLANT
CLOTH BEACON ORANGE TIMEOUT ST (SAFETY) ×2 IMPLANT
COVER MAYO STAND STRL (DRAPES) ×2 IMPLANT
DECANTER SPIKE VIAL GLASS SM (MISCELLANEOUS) IMPLANT
DERMABOND ADVANCED (GAUZE/BANDAGES/DRESSINGS) ×1
DERMABOND ADVANCED .7 DNX12 (GAUZE/BANDAGES/DRESSINGS) ×1 IMPLANT
DRAPE C-ARM 42X72 X-RAY (DRAPES) ×2 IMPLANT
DRAPE UTILITY 15X26 W/TAPE STR (DRAPE) ×2 IMPLANT
ELECT REM PT RETURN 9FT ADLT (ELECTROSURGICAL) ×2
ELECTRODE REM PT RTRN 9FT ADLT (ELECTROSURGICAL) ×1 IMPLANT
GLOVE BIO SURGEON STRL SZ 6.5 (GLOVE) ×2 IMPLANT
GLOVE BIO SURGEON STRL SZ8 (GLOVE) ×3 IMPLANT
GLOVE BIOGEL M STRL SZ7.5 (GLOVE) ×1 IMPLANT
GLOVE BIOGEL PI IND STRL 7.0 (GLOVE) IMPLANT
GLOVE BIOGEL PI IND STRL 8 (GLOVE) ×1 IMPLANT
GLOVE BIOGEL PI INDICATOR 7.0 (GLOVE) ×2
GLOVE BIOGEL PI INDICATOR 8 (GLOVE) ×3
GOWN PREVENTION PLUS XLARGE (GOWN DISPOSABLE) ×7 IMPLANT
GOWN STRL REIN 2XL LVL4 (GOWN DISPOSABLE) ×1 IMPLANT
HEMOSTAT SNOW SURGICEL 2X4 (HEMOSTASIS) ×1 IMPLANT
HEMOSTAT SURGICEL 2X14 (HEMOSTASIS) ×1 IMPLANT
NS IRRIG 1000ML POUR BTL (IV SOLUTION) ×1 IMPLANT
PACK BASIN DAY SURGERY FS (CUSTOM PROCEDURE TRAY) ×2 IMPLANT
POUCH SPECIMEN RETRIEVAL 10MM (ENDOMECHANICALS) ×2 IMPLANT
SCISSORS LAP 5X35 DISP (ENDOMECHANICALS) IMPLANT
SET CHOLANGIOGRAPH 5 50 .035 (SET/KITS/TRAYS/PACK) ×1 IMPLANT
SET IRRIG TUBING LAPAROSCOPIC (IRRIGATION / IRRIGATOR) ×2 IMPLANT
SLEEVE ENDOPATH XCEL 5M (ENDOMECHANICALS) ×4 IMPLANT
SLEEVE SCD COMPRESS KNEE MED (MISCELLANEOUS) ×2 IMPLANT
SUT MNCRL AB 4-0 PS2 18 (SUTURE) ×2 IMPLANT
SUT VICRYL 0 UR6 27IN ABS (SUTURE) IMPLANT
TOWEL OR 17X24 6PK STRL BLUE (TOWEL DISPOSABLE) ×2 IMPLANT
TOWEL OR NON WOVEN STRL DISP B (DISPOSABLE) ×2 IMPLANT
TRAY LAPAROSCOPIC (CUSTOM PROCEDURE TRAY) ×2 IMPLANT
TROCAR XCEL BLUNT TIP 100MML (ENDOMECHANICALS) ×2 IMPLANT
TROCAR XCEL NON-BLD 11X100MML (ENDOMECHANICALS) ×2 IMPLANT
TROCAR XCEL NON-BLD 5MMX100MML (ENDOMECHANICALS) ×2 IMPLANT

## 2011-07-01 NOTE — Anesthesia Postprocedure Evaluation (Signed)
  Anesthesia Post-op Note  Patient: Courtney Reed  Procedure(s) Performed: Procedure(s) (LRB): LAPAROSCOPIC CHOLECYSTECTOMY WITH INTRAOPERATIVE CHOLANGIOGRAM (N/A)  Patient Location: PACU  Anesthesia Type: General  Level of Consciousness: awake, alert  and oriented  Airway and Oxygen Therapy: Patient Spontanous Breathing  Post-op Pain: mild  Post-op Assessment: Post-op Vital signs reviewed, Patient's Cardiovascular Status Stable, Respiratory Function Stable, Patent Airway, No signs of Nausea or vomiting and Pain level controlled  Post-op Vital Signs: Reviewed and stable  Complications: No apparent anesthesia complications

## 2011-07-01 NOTE — Interval H&P Note (Signed)
History and Physical Interval Note:  07/01/2011 12:34 PM  Courtney Reed  has presented today for surgery, with the diagnosis of gallstones  The various methods of treatment have been discussed with the patient and family. After consideration of risks, benefits and other options for treatment, the patient has consented to  Procedure(s) (LRB): LAPAROSCOPIC CHOLECYSTECTOMY WITH INTRAOPERATIVE CHOLANGIOGRAM (N/A) as a surgical intervention .  The patient's history has been reviewed, patient examined, no change in status, stable for surgery.  I have reviewed the patients' chart and labs.  Questions were answered to the patient's satisfaction.     Courtney Reed A.

## 2011-07-01 NOTE — Op Note (Signed)
Laparoscopic Cholecystectomy with IOC Procedure Note  Indications: This patient presents with symptomatic gallbladder disease and will undergo laparoscopic cholecystectomy.  Pre-operative Diagnosis: Calculus of gallbladder without mention of cholecystitis or obstruction  Post-operative Diagnosis: Same  Surgeon: Tiah Heckel A.   Assistants: OR  Anesthesia: General endotracheal anesthesia and Local anesthesia 0.25.% bupivacaine, with epinephrine  ASA Class: 3  Procedure Details  The patient was seen again in the Holding Room. The risks, benefits, complications, treatment options, and expected outcomes were discussed with the patient. The possibilities of reaction to medication, pulmonary aspiration, perforation of viscus, bleeding, recurrent infection, finding a normal gallbladder, the need for additional procedures, failure to diagnose a condition, the possible need to convert to an open procedure, and creating a complication requiring transfusion or operation were discussed with the patient. The patient and/or family concurred with the proposed plan, giving informed consent. The site of surgery properly noted/marked. The patient was taken to Operating Room, identified as Courtney Reed [facility] and the procedure verified as Laparoscopic Cholecystectomy with Intraoperative Cholangiograms. A Time Out was held and the above information confirmed.  Prior to the induction of general anesthesia, antibiotic prophylaxis was administered. General endotracheal anesthesia was then administered and tolerated well. After the induction, the abdomen was prepped in the usual sterile fashion. The patient was positioned in the supine position with the left arm comfortably tucked, along with some reverse Trendelenburg.  Local anesthetic agent was injected into the skin near the umbilicus and an incision made. The midline fascia was incised and the Hasson technique was used to introduce a 10 mm port under direct vision.  It was secured with a figure of eight Vicryl suture placed in the usual fashion. Pneumoperitoneum was then created with CO2 and tolerated well without any adverse changes in the patient's vital signs. Additional trocars were introduced under direct vision. All skin incisions were infiltrated with a local anesthetic agent before making the incision and placing the trocars.   The gallbladder was identified, the fundus grasped and retracted cephalad. Adhesions were lysed bluntly and with the electrocautery where indicated, taking care not to injure any adjacent organs or viscus. The infundibulum was grasped and retracted laterally, exposing the peritoneum overlying the triangle of Calot. This was then divided and exposed in a blunt fashion. The cystic duct was clearly identified and bluntly dissected circumferentially. The junctions of the gallbladder, cystic duct and common bile duct were clearly identified prior to the division of any linear structure.  Critical view achieved.   An incision was made in the cystic duct and the cholangiogram catheter introduced. The catheter was secured using an endoclip. The study showed no stones and good visualization of the distal and proximal biliary tree. The catheter was then removed.   The cystic duct was then  ligated with surgical clips  on the patient side and  clipped on the gallbladder side and divided. The cystic artery was identified, dissected free, ligated with clips and divided as well. Posterior cystic artery clipped and divided.  The gallbladder was dissected from the liver bed in retrograde fashion with the electrocautery. The gallbladder was removed. The liver bed was irrigated and inspected. Hemostasis was achieved with the electrocautery. Surgicel placed in the gallbladder bed.   Copious irrigation was utilized and was repeatedly aspirated until clear all particulate matter.  Pneumoperitoneum was completely reduced after viewing removal of the trocars  under direct vision. The wound was thoroughly irrigated and the fascia was then closed with a figure of eight suture;  the skin was then closed with 4 0 MONOCRYL  and a sterile dressing was applied.  Instrument, sponge, and needle counts were correct at closure and at the conclusion of the case.   Findings: Cholecystitis with Cholelithiasis  Estimated Blood Loss: less than 50 mL         Drains: NONE         Total IV Fluids: 800 mL         Specimens: Gallbladder           Complications: None; patient tolerated the procedure well.         Disposition: PACU - hemodynamically stable.         Condition: stable

## 2011-07-01 NOTE — Anesthesia Procedure Notes (Signed)
Procedure Name: Intubation Date/Time: 07/01/2011 1:10 PM Performed by: Lynora Dymond D Pre-anesthesia Checklist: Patient identified, Emergency Drugs available, Suction available and Patient being monitored Patient Re-evaluated:Patient Re-evaluated prior to inductionOxygen Delivery Method: Circle System Utilized Preoxygenation: Pre-oxygenation with 100% oxygen Intubation Type: IV induction Ventilation: Mask ventilation without difficulty Laryngoscope Size: Mac and 3 Grade View: Grade I Tube type: Oral Tube size: 7.0 mm Number of attempts: 1 Airway Equipment and Method: stylet and oral airway Placement Confirmation: ETT inserted through vocal cords under direct vision,  positive ETCO2 and breath sounds checked- equal and bilateral Secured at: 20 cm Tube secured with: Tape Dental Injury: Teeth and Oropharynx as per pre-operative assessment

## 2011-07-01 NOTE — Transfer of Care (Signed)
Immediate Anesthesia Transfer of Care Note  Patient: Courtney Reed  Procedure(s) Performed: Procedure(s) (LRB): LAPAROSCOPIC CHOLECYSTECTOMY WITH INTRAOPERATIVE CHOLANGIOGRAM (N/A)  Patient Location: PACU  Anesthesia Type: General  Level of Consciousness: awake, alert , oriented and patient cooperative  Airway & Oxygen Therapy: Patient Spontanous Breathing and Patient connected to face mask oxygen  Post-op Assessment: Report given to PACU RN and Post -op Vital signs reviewed and stable  Post vital signs: Reviewed and stable  Complications: No apparent anesthesia complications

## 2011-07-01 NOTE — Discharge Instructions (Signed)
[facility] ______[facility] °LAPAROSCOPIC SURGERY: POST OP INSTRUCTIONS °Always review your discharge instruction sheet given to you by the facility where your surgery was performed. °IF YOU HAVE DISABILITY OR FAMILY LEAVE FORMS, YOU MUST BRING THEM TO THE OFFICE FOR PROCESSING.   °DO NOT GIVE THEM TO YOUR DOCTOR. ° °1. A prescription for pain medication may be given to you upon discharge.  Take your pain medication as prescribed, if needed.  If narcotic pain medicine is not needed, then you may take acetaminophen (Tylenol) or ibuprofen (Advil) as needed. °2. Take your usually prescribed medications unless otherwise directed. °3. If you need a refill on your pain medication, please contact your pharmacy.  They will contact our office to request authorization. Prescriptions will not be filled after 5pm or on week-ends. °4. You should follow a light diet the first few days after arrival home, such as soup and crackers, etc.  Be sure to include lots of fluids daily. °5. Most patients will experience some swelling and bruising in the area of the incisions.  Ice packs will help.  Swelling and bruising can take several days to resolve.  °6. It is common to experience some constipation if taking pain medication after surgery.  Increasing fluid intake and taking a stool softener (such as Colace) will usually help or prevent this problem from occurring.  A mild laxative (Milk of Magnesia or Miralax) should be taken according to package instructions if there are no bowel movements after 48 hours. °7. Unless discharge instructions indicate otherwise, you may remove your bandages 24-48 hours after surgery, and you may shower at that time.  You may have steri-strips (small skin tapes) in place directly over the incision.  These strips should be left on the skin for 7-10 days.  If your surgeon used skin glue on the incision, you may shower in 24 hours.  The glue will flake off over the next 2-3 weeks.  Any sutures or  staples will be removed at the office during your follow-up visit. °8. ACTIVITIES:  You may resume regular (light) daily activities beginning the next day--such as daily self-care, walking, climbing stairs--gradually increasing activities as tolerated.  You may have sexual intercourse when it is comfortable.  Refrain from any heavy lifting or straining until approved by your doctor. °a. You may drive when you are no longer taking prescription pain medication, you can comfortably wear a seatbelt, and you can safely maneuver your car and apply brakes. °b. RETURN TO WORK:  __________________________________________________________ °9. You should see your doctor in the office for a follow-up appointment approximately 2-3 weeks after your surgery.  Make sure that you call for this appointment within a day or two after you arrive home to insure a convenient appointment time. °10. OTHER INSTRUCTIONS: __________________________________________________________________________________________________________________________ __________________________________________________________________________________________________________________________ °WHEN TO CALL YOUR DOCTOR: °1. Fever over 101.0 °2. Inability to urinate °3. Continued bleeding from incision. °4. Increased pain, redness, or drainage from the incision. °5. Increasing abdominal pain ° °The clinic staff is available to answer your questions during regular business hours.  Please don’t hesitate to call and ask to speak to one of the nurses for clinical concerns.  If you have a medical emergency, go to the nearest emergency room or call 911.  A surgeon from [facility] is always on call at the hospital. °[address], [address], [place],   [address] ? [address] [address], [place],    [address] °[phone] ? [phone] ? FAX [phone] °Web site:   [email or web address] ° ° °Post Anesthesia Home Care Instructions ° °Activity: °Get  plenty of rest for the remainder of the day. A responsible adult should stay with you for 24 hours following the procedure.  °For the next 24 hours, DO NOT: °-Drive a car °-Operate machinery °-Drink alcoholic beverages °-Take any medication unless instructed by your physician °-Make any legal decisions or sign important papers. ° °Meals: °Start with liquid foods such as gelatin or soup. Progress to regular foods as tolerated. Avoid greasy, spicy, heavy foods. If nausea and/or vomiting occur, drink only clear liquids until the nausea and/or vomiting subsides. Call your physician if vomiting continues. ° °Special Instructions/Symptoms: °Your throat may feel dry or sore from the anesthesia or the breathing tube placed in your throat during surgery. If this causes discomfort, gargle with warm salt water. The discomfort should disappear within 24 hours. ° °

## 2011-07-01 NOTE — H&P (View-Only) (Signed)
Patient ID: Courtney Reed, female   DOB: May 25, 1926, 78 y.o.   MRN: [id number]  Chief Complaint  Patient presents with  . New Evaluation    New GB with Stones    HPI Courtney Reed is a 78 y.o. female.   HPIPatient seen today at the request of Dr. Clovis Riley due to nausea, vomiting and right upper quadrant abdominal pain for 2 months. She has a history of a gallstone since 1983. Over the last 2 months she has developed more nausea and right upper quadrant pain. She has Parkinson's disease. She was trying to medication which causes nausea and initially thought it was secondary to that. She's having more right upper quadrant pain especially with eating. No weight loss. Her Parkinson's is early.  Past Medical History  Diagnosis Date  . Thyroid disease     Past Surgical History  Procedure Date  . Thyroidectomy   . Hysterecomy - unknown type   . Appendectomy 1950    Family History  Problem Relation Age of Onset  . Heart attack    . Cancer    . Coronary artery disease    . Heart disease Father     Heart attack    Social History History  Substance Use Topics  . Smoking status: Never Smoker   . Smokeless tobacco: Not on file  . Alcohol Use: No    Allergies  Allergen Reactions  . Sulfa Antibiotics     Current Outpatient Prescriptions  Medication Sig Dispense Refill  . aspirin 81 MG tablet Take 81 mg by mouth daily.      Marland Kitchen BIOTIN PO Take by mouth daily.      . Calcium-Magnesium-Zinc (CALCIUM-MAGNESUIUM-ZINC PO) Take by mouth daily.      Marland Kitchen estrogens, conjugated, (PREMARIN) 0.625 MG tablet Take 0.625 mg by mouth 2 (two) times a week. .      . levothyroxine (SYNTHROID, LEVOTHROID) 75 MCG tablet Take 75 mcg by mouth daily.      . Multiple Vitamin (MULTIVITAMIN) tablet Take 1 tablet by mouth daily.      Marland Kitchen rOPINIRole (REQUIP) 1 MG tablet         Review of Systems Review of Systems  Constitutional: Positive for appetite change. Negative for chills.  HENT: Negative.   Eyes:  Negative.   Respiratory: Negative.   Cardiovascular: Negative.   Gastrointestinal: Positive for nausea, vomiting and abdominal pain.  Genitourinary: Negative.   Musculoskeletal: Positive for gait problem.  Neurological: Positive for tremors.  Hematological: Negative.   Psychiatric/Behavioral: Negative.     Blood pressure 146/86, pulse 78, temperature 98.3 F (36.8 C), temperature source Temporal, resp. rate 14, height 5\' 1"  (1.549 m), weight 134 lb 12.8 oz (61.145 kg).  Physical Exam Physical Exam  Constitutional: She is oriented to person, place, and time. She appears well-developed and well-nourished.  HENT:  Head: Normocephalic and atraumatic.  Eyes: EOM are normal. Pupils are equal, round, and reactive to light.  Neck: Normal range of motion. Neck supple.  Cardiovascular: Normal rate and regular rhythm.   Pulmonary/Chest: Effort normal and breath sounds normal.  Abdominal: Soft. Bowel sounds are normal. She exhibits no distension. There is no tenderness. There is no rebound.  Musculoskeletal: Normal range of motion.  Neurological: She is alert and oriented to person, place, and time.  Skin: Skin is warm and dry.  Psychiatric: She has a normal mood and affect. Her behavior is normal. Judgment and thought content normal.    Data Reviewed 3 cm  gallstone .  History of Murphy's sign  CBD normal Assessment    Cholelithiasis with biliary colic Past Medical History  Diagnosis Date  . Thyroid disease   Pakinson's Disease    Plan    Laparoscopic cholecystectomy and cholangiogram.  The procedure has been discussed with the patient. Operative and non operative treatments have been discussed. Risks of surgery include bleeding, infection,  Common bile duct injury,  Injury to the stomach,liver, colon,small intestine, abdominal wall,  Diaphragm,  Major blood vessels,  And the need for an open procedure.  Other risks include worsening of medical problems, death,  DVT and pulmonary  embolism, and cardiovascular events.   Medical options have also been discussed. The patient has been informed of long term expectations of surgery and non surgical options,  The patient agrees to proceed.         Leba Tibbitts A. 06/04/2011, 2:25 PM

## 2011-07-01 NOTE — Anesthesia Preprocedure Evaluation (Signed)
Anesthesia Evaluation  Patient identified by MRN, date of birth, ID band Patient awake    Reviewed: Allergy & Precautions, H&P , NPO status , Patient's Chart, lab work & pertinent test results  History of Anesthesia Complications Negative for: history of anesthetic complications  Airway Mallampati: I TM Distance: >3 FB Neck ROM: Full    Dental No notable dental hx. (+) Edentulous Upper and Dental Advisory Given   Pulmonary neg pulmonary ROS,  breath sounds clear to auscultation  Pulmonary exam normal       Cardiovascular negative cardio ROS  Rhythm:Regular Rate:Normal     Neuro/Psych  Neuromuscular disease (Parkinson's disease) negative psych ROS   GI/Hepatic Neg liver ROS, GERD-  Controlled,  Endo/Other  Hypothyroidism (on replacement)   Renal/GU negative Renal ROS     Musculoskeletal   Abdominal   Peds  Hematology negative hematology ROS (+)   Anesthesia Other Findings   Reproductive/Obstetrics                           Anesthesia Physical Anesthesia Plan  ASA: II  Anesthesia Plan: General   Post-op Pain Management:    Induction: Intravenous  Airway Management Planned: Oral ETT  Additional Equipment:   Intra-op Plan:   Post-operative Plan: Extubation in OR  Informed Consent: I have reviewed the patients History and Physical, chart, labs and discussed the procedure including the risks, benefits and alternatives for the proposed anesthesia with the patient or authorized representative who has indicated his/her understanding and acceptance.   Dental advisory given  Plan Discussed with: CRNA and Surgeon  Anesthesia Plan Comments: (Plan routine monitors, GETA)        Anesthesia Quick Evaluation

## 2011-07-02 NOTE — Discharge Summary (Signed)
Physician Discharge Summary  Patient ID: Courtney Reed MRN: [id number] DOB/AGE: [birth year] 76 y.o.  Admit date: 07/01/2011 Discharge date: 07/02/2011  Admission Diagnoses: gallstones  Discharge Diagnoses: galstones Active Problems:  * No active hospital problems. *    Discharged Condition: good  Hospital Course: unremarkable.  Consults: none  Significant Diagnostic Studies: cholangiogram  normal  Treatments: surgery: lap chole and IOC  Discharge Exam: Blood pressure 144/68, pulse 72, temperature 97.8 F (36.6 C), temperature source Oral, resp. rate 20, SpO2 99.00%. Incision/Wound:CLEAN DRY INTACT SOFT NON TENDER  Disposition:   Discharge Orders    Future Appointments: Provider: Department: Dept Phone: Center:   07/18/2011 11:50 AM Maisie Fus A. Raquan Iannone, MD [facility] [phone] None     Future Orders Please Complete By Expires   Diet - low sodium heart healthy      Increase activity slowly        Medication List  As of 07/02/2011  6:53 AM   TAKE these medications         aspirin 81 MG tablet   Take 81 mg by mouth daily.      BIOTIN PO   Take by mouth daily.      CALCIUM-MAGNESUIUM-ZINC PO   Take by mouth daily.      estrogens (conjugated) 0.625 MG tablet   Commonly known as: PREMARIN   Take 0.625 mg by mouth 2 (two) times a week. .      levothyroxine 75 MCG tablet   Commonly known as: SYNTHROID, LEVOTHROID   Take 75 mcg by mouth daily.      multivitamin tablet   Take 1 tablet by mouth daily.      oxyCODONE-acetaminophen 5-325 MG per tablet   Commonly known as: PERCOCET   Take 1 tablet by mouth every 4 (four) hours as needed for pain.      rOPINIRole 1 MG tablet   Commonly known as: REQUIP   Take 2 mg by mouth 3 (three) times daily.           Follow-up Information    Follow up with [name], MD. Schedule an appointment as soon as possible for a visit in 3 weeks.   Contact information:   [employer] [address] [place] [address] [phone]          Signed: Dortha Schwalbe 07/02/2011, 6:53 AM

## 2011-07-03 ENCOUNTER — Encounter (HOSPITAL_BASED_OUTPATIENT_CLINIC_OR_DEPARTMENT_OTHER): Payer: Self-pay | Admitting: Surgery

## 2011-07-18 ENCOUNTER — Ambulatory Visit (INDEPENDENT_AMBULATORY_CARE_PROVIDER_SITE_OTHER): Payer: Medicare Other | Admitting: Surgery

## 2011-07-18 ENCOUNTER — Encounter (INDEPENDENT_AMBULATORY_CARE_PROVIDER_SITE_OTHER): Payer: Self-pay | Admitting: Surgery

## 2011-07-18 VITALS — BP 142/80 | HR 78 | Temp 96.8°F | Resp 18 | Wt 131.4 lb

## 2011-07-18 DIAGNOSIS — Z9889 Other specified postprocedural states: Secondary | ICD-10-CM

## 2011-07-18 NOTE — Patient Instructions (Signed)
Return as needed, 

## 2011-07-18 NOTE — Progress Notes (Signed)
NAME: Courtney Reed       DOB: 1927/01/05           DATE: 07/18/2011       ZOX:[id number]   CC: Postop laparoscopic cholecystectomy  HPI:  This patient underwent a laparoscopic cholecystectomy and operative cholangiogram on 07/01/2011. She is in for her first postoperative visit. She notes that her incisional pain has resolved. Her preoperative symptoms have improved. She is not having problems with nausea, vomiting, diarrhea, fevers, chills, or urinary symptoms. She is tolerating diet. She feels that she is progressing well and nearly back to normal. PE:  VS: BP 142/80  Pulse 78  Temp 96.8 F (36 C) (Temporal)  Resp 18  Wt 131 lb 6 oz (59.591 kg)  General: The patient is alert and appears comfortable, NAD.  Abdomen: Soft and benign. The incisions are healing nicely. There are no apparent problems.  Data reviewed: IOC:  normal Pathology:  gallstone mild chronic cholecystitis  Impression:  The patient appears to be doing well, with improvement in her symptoms.  Plan:  She may resume full activity and regular diet. She  will followup with [place] on a p.r.n. basis. I did tell her that she may still have some foods that cause indigestion and ask her to call us if there are any questions, problems or concerns.

## 2011-09-03 DIAGNOSIS — G20 Parkinson's disease: Secondary | ICD-10-CM | POA: Diagnosis not present

## 2011-09-13 ENCOUNTER — Encounter: Payer: Self-pay | Admitting: Cardiovascular Disease

## 2011-11-18 ENCOUNTER — Ambulatory Visit (INDEPENDENT_AMBULATORY_CARE_PROVIDER_SITE_OTHER): Payer: Medicare Other | Admitting: Cardiovascular Disease

## 2011-11-18 ENCOUNTER — Encounter: Payer: Self-pay | Admitting: Cardiovascular Disease

## 2011-11-18 VITALS — BP 148/76 | HR 69 | Ht 62.0 in | Wt 131.0 lb

## 2011-11-18 DIAGNOSIS — R0600 Dyspnea, unspecified: Secondary | ICD-10-CM | POA: Insufficient documentation

## 2011-11-18 DIAGNOSIS — R0789 Other chest pain: Secondary | ICD-10-CM | POA: Diagnosis not present

## 2011-11-18 DIAGNOSIS — R0989 Other specified symptoms and signs involving the circulatory and respiratory systems: Secondary | ICD-10-CM

## 2011-11-18 NOTE — Progress Notes (Signed)
Courtney Reed Date of Birth  Jun 19, 1926       [facility]    [facility] [address]  [address], [address] [place], [place]  [address]   [place], [place]  [address] [phone]     [phone]   Fax  [phone]    Fax [phone]  Problem List: 1. Chest pain 2. Anxiety 3. Status post cholecystectomy  History of Present Illness:  Courtney Reed is an 78 year old female who was seen 2 years ago. She's had intermittent chest pains in the past. She been diagnosed with Parkinson's disease. She is recently status post cholecystectomy several months ago.  She presents with episodes of shortness of breath and occasional episodes of chest discomfort. She does not do any exercise. She has had a stroke and is somewhat limited in her memory.  Current Outpatient Prescriptions on File Prior to Visit  Medication Sig Dispense Refill  . aspirin 81 MG tablet Take 81 mg by mouth daily.      . Calcium-Magnesium-Zinc (CALCIUM-MAGNESUIUM-ZINC PO) Take by mouth daily.      Marland Kitchen estrogens, conjugated, (PREMARIN) 0.625 MG tablet Take 0.625 mg by mouth 2 (two) times a week. .      . levothyroxine (SYNTHROID, LEVOTHROID) 75 MCG tablet Take 75 mcg by mouth daily.      Marland Kitchen rOPINIRole (REQUIP) 1 MG tablet Take 2 mg by mouth 3 (three) times daily.         Allergies  Allergen Reactions  . Sulfa Antibiotics     Past Medical History  Diagnosis Date  . Thyroid disease   . Hypothyroidism   . Nausea   . Gallstones   . Parkinson disease     Past Surgical History  Procedure Date  . Thyroidectomy   . Hysterecomy - unknown type   . Appendectomy 1950  . Eye surgery     both cataracts  . Cholecystectomy 07/01/2011    Procedure: LAPAROSCOPIC CHOLECYSTECTOMY WITH INTRAOPERATIVE CHOLANGIOGRAM;  Surgeon: Clovis Pu. Cornett, MD;  [facility];  Service: General;  Laterality: N/A;    History  Smoking status  . Never Smoker   Smokeless tobacco  . Not on  file    History  Alcohol Use No    Family History  Problem Relation Age of Onset  . Heart attack    . Cancer    . Coronary artery disease    . Heart disease Father     Heart attack    Reviw of Systems:  Reviewed in the HPI.  All other systems are negative.  Physical Exam: Blood pressure 148/76, pulse 69, height 5\' 2"  (1.575 m), weight 131 lb (59.421 kg), SpO2 94.00%. General: Well developed, well nourished, in no acute distress.  Head: Normocephalic, atraumatic, sclera non-icteric, mucus membranes are moist,   Neck: Supple. Carotids are 2 + without bruits. No JVD  Lungs: Clear bilaterally to auscultation.  Heart: regular rate.  normal  S1 S2. There is a soft systolic murmur.  Abdomen: Soft, non-tender, non-distended with normal bowel sounds. No hepatomegaly. No rebound/guarding. No masses.  Msk:  Strength and tone are normal  Extremities: No clubbing or cyanosis. No edema.  Distal pedal pulses are 2+ and equal bilaterally.  Neuro: Alert and oriented X 3. Moves all extremities spontaneously.  Psych:  Responds to questions appropriately with a normal affect.  ECG: 11/18/2011-normal sinus rhythm 69 beats a minute. She has nonspecific ST and T wave abnormalities.  Assessment /  Plan:

## 2011-11-18 NOTE — Patient Instructions (Addendum)
Your physician has requested that you have a lexiscan myoview.  Please follow instruction sheet, as given.  Your physician wants you to follow-up in: 1 year unless something is seen on stress test. You will receive a reminder letter in the mail two months in advance. If you don't receive a letter, please call our office to schedule the follow-up appointment.  Your physician recommends that you continue on your current medications as directed. Please refer to the Current Medication list given to you today.

## 2011-11-18 NOTE — Assessment & Plan Note (Signed)
Courtney Reed presents with some episodes of chest discomfort. These occur at various times. They're associated with some nausea and some shortness of breath. She does not describe exertional symptoms but she does have a mildly abnormal EKG.  She had a negative stress test in 2007. It would be reasonable to repeat a lexiscan Myoivew.  Given her history Parkinson's and history of stroke she has a difficult time providing history. I'll see her again in one year for followup visit.

## 2011-11-27 ENCOUNTER — Ambulatory Visit (HOSPITAL_COMMUNITY): Payer: Medicare Other | Attending: Cardiology | Admitting: Radiology

## 2011-11-27 VITALS — BP 143/74 | HR 69 | Ht 62.0 in | Wt 133.0 lb

## 2011-11-27 DIAGNOSIS — R079 Chest pain, unspecified: Secondary | ICD-10-CM | POA: Diagnosis not present

## 2011-11-27 DIAGNOSIS — R9431 Abnormal electrocardiogram [ECG] [EKG]: Secondary | ICD-10-CM

## 2011-11-27 DIAGNOSIS — R000 Tachycardia, unspecified: Secondary | ICD-10-CM | POA: Diagnosis not present

## 2011-11-27 DIAGNOSIS — R0602 Shortness of breath: Secondary | ICD-10-CM | POA: Insufficient documentation

## 2011-11-27 DIAGNOSIS — R0789 Other chest pain: Secondary | ICD-10-CM

## 2011-11-27 DIAGNOSIS — R0989 Other specified symptoms and signs involving the circulatory and respiratory systems: Secondary | ICD-10-CM | POA: Insufficient documentation

## 2011-11-27 DIAGNOSIS — R0609 Other forms of dyspnea: Secondary | ICD-10-CM | POA: Insufficient documentation

## 2011-11-27 DIAGNOSIS — R0600 Dyspnea, unspecified: Secondary | ICD-10-CM

## 2011-11-27 MED ORDER — TECHNETIUM TC 99M SESTAMIBI GENERIC - CARDIOLITE
11.0000 | Freq: Once | INTRAVENOUS | Status: AC | PRN
  Administered 2011-11-27: 11 via INTRAVENOUS

## 2011-11-27 MED ORDER — TECHNETIUM TC 99M SESTAMIBI GENERIC - CARDIOLITE
33.0000 | Freq: Once | INTRAVENOUS | Status: AC | PRN
  Administered 2011-11-27: 33 via INTRAVENOUS

## 2011-11-27 MED ORDER — REGADENOSON 0.4 MG/5ML IV SOLN
0.4000 mg | Freq: Once | INTRAVENOUS | Status: AC
  Administered 2011-11-27: 0.4 mg via INTRAVENOUS

## 2011-11-27 NOTE — Progress Notes (Signed)
[facility] SITE 3 NUCLEAR MED [id number] [place] [address] [phone]  Cardiology Nuclear Med Study  Courtney Reed is a 78 y.o. female     MRN : [id number]     DOB: Jun 10, 1926  Procedure Date: 11/27/2011  Nuclear Med Background Indication for Stress Test:  Evaluation for Ischemia and Abnormal EKG History:  '07 GEX:BMWUXL, EF=78%; '10 Echo:EF=65% Cardiac Risk Factors: CVA and Family History - CAD  Symptoms:  Chest Tightness with and without Exertion (last episode of chest discomfort was Monday), Diaphoresis, DOE/SOB and Rapid HR   Nuclear Pre-Procedure Caffeine/Decaff Intake:  8:30pm NPO After: 7:00am, orange juice  Lungs:  Clear. O2 Sat: 99% on room air. IV 0.9% NS with Angio Cath:  22g  IV Site: R Antecubital x 1, tolerated well IV Started by:  Irean Hong, RN  Chest Size (in):  36 Cup Size: D  Height: 5\' 2"  (1.575 m)  Weight:  133 lb (60.328 kg)  BMI:  Body mass index is 24.33 kg/(m^2). Tech Comments:  n/a    Nuclear Med Study 1 or 2 day study: 1 day  Stress Test Type:  Lexiscan  Reading MD: Olga Millers, MD  Order Authorizing Provider:  Kristeen Miss, MD  Resting Radionuclide: Technetium 13m Sestamibi  Resting Radionuclide Dose: 11.0 mCi   Stress Radionuclide:  Technetium 51m Sestamibi  Stress Radionuclide Dose: 33.0 mCi           Stress Protocol Rest HR: 69 Stress HR: 100  Rest BP: 143/74 Stress BP: 157/70  Exercise Time (min): n/a METS: n/a   Predicted Max HR: 137 bpm % Max HR: 72.99 bpm Rate Pressure Product: 24401   Dose of Adenosine (mg):  n/a Dose of Lexiscan: 0.4 mg  Dose of Atropine (mg): n/a Dose of Dobutamine: n/a mcg/kg/min (at max HR)  Stress Test Technologist: Smiley Houseman, CMA-N  Nuclear Technologist:  Domenic Polite, CNMT     Rest Procedure:  Myocardial perfusion imaging was performed at rest 45 minutes following the intravenous administration of Technetium 2m Sestamibi.  Rest ECG: Nonspecific  ST-T wave changes.  Stress Procedure:  The patient received IV Lexiscan 0.4 mg over 15-seconds.  Technetium 19m Sestamibi was injected at 30-seconds.  There were nonspecific T-wave changes with Lexiscan.  Quantitative spect images were obtained after a 45 minute delay.  Stress ECG: No significant ST segment change suggestive of ischemia.  QPS Raw Data Images:  Acquisition technically good; normal left ventricular size, question right breast mass. Stress Images:  Normal homogeneous uptake in all areas of the myocardium. Rest Images:  Normal homogeneous uptake in all areas of the myocardium. Subtraction (SDS):  No evidence of ischemia. Transient Ischemic Dilatation (Normal <1.22):  0.95 Lung/Heart Ratio (Normal <0.45):  0.39  Quantitative Gated Spect Images QGS EDV:  45 ml QGS ESV:  6 ml  Impression Exercise Capacity:  Lexiscan with no exercise. BP Response:  Normal blood pressure response. Clinical Symptoms:  There is chest pain and dyspnea. ECG Impression:  No significant ST segment change suggestive of ischemia. Comparison with Prior Nuclear Study: No images to compare  Overall Impression:  Normal stress nuclear study; note increased uptake possibly in right breast; suggest mammogram and physical/clinical correlation.  LV Ejection Fraction: 87%.  LV Wall Motion:  NL LV Function; NL Wall Motion  Olga Millers

## 2011-12-02 ENCOUNTER — Other Ambulatory Visit: Payer: Self-pay | Admitting: Family Medicine

## 2011-12-02 DIAGNOSIS — Z1231 Encounter for screening mammogram for malignant neoplasm of breast: Secondary | ICD-10-CM

## 2011-12-07 DIAGNOSIS — Z23 Encounter for immunization: Secondary | ICD-10-CM | POA: Diagnosis not present

## 2011-12-13 ENCOUNTER — Other Ambulatory Visit: Payer: Self-pay | Admitting: Family Medicine

## 2011-12-13 ENCOUNTER — Ambulatory Visit
Admission: RE | Admit: 2011-12-13 | Discharge: 2011-12-13 | Disposition: A | Discharge status: Home / Self Care | Payer: Medicare Other | Source: Ambulatory Visit | Attending: Family Medicine | Admitting: Family Medicine

## 2011-12-13 DIAGNOSIS — N63 Unspecified lump in unspecified breast: Secondary | ICD-10-CM

## 2011-12-13 DIAGNOSIS — Z1231 Encounter for screening mammogram for malignant neoplasm of breast: Secondary | ICD-10-CM

## 2011-12-13 DIAGNOSIS — N6489 Other specified disorders of breast: Secondary | ICD-10-CM | POA: Diagnosis not present

## 2012-02-11 DIAGNOSIS — H26499 Other secondary cataract, unspecified eye: Secondary | ICD-10-CM | POA: Diagnosis not present

## 2012-03-04 DIAGNOSIS — G20 Parkinson's disease: Secondary | ICD-10-CM | POA: Diagnosis not present

## 2012-03-12 DIAGNOSIS — N951 Menopausal and female climacteric states: Secondary | ICD-10-CM | POA: Diagnosis not present

## 2012-03-12 DIAGNOSIS — I671 Cerebral aneurysm, nonruptured: Secondary | ICD-10-CM | POA: Diagnosis not present

## 2012-03-12 DIAGNOSIS — E039 Hypothyroidism, unspecified: Secondary | ICD-10-CM | POA: Diagnosis not present

## 2012-03-12 DIAGNOSIS — Z79899 Other long term (current) drug therapy: Secondary | ICD-10-CM | POA: Diagnosis not present

## 2012-03-12 DIAGNOSIS — F411 Generalized anxiety disorder: Secondary | ICD-10-CM | POA: Diagnosis not present

## 2012-03-12 DIAGNOSIS — M159 Polyosteoarthritis, unspecified: Secondary | ICD-10-CM | POA: Diagnosis not present

## 2012-03-12 DIAGNOSIS — G20 Parkinson's disease: Secondary | ICD-10-CM | POA: Diagnosis not present

## 2012-03-12 DIAGNOSIS — I7300 Raynaud's syndrome without gangrene: Secondary | ICD-10-CM | POA: Diagnosis not present

## 2012-04-10 DIAGNOSIS — H26499 Other secondary cataract, unspecified eye: Secondary | ICD-10-CM | POA: Diagnosis not present

## 2012-05-13 DIAGNOSIS — H26499 Other secondary cataract, unspecified eye: Secondary | ICD-10-CM | POA: Diagnosis not present

## 2012-08-17 DIAGNOSIS — W19XXXA Unspecified fall, initial encounter: Secondary | ICD-10-CM | POA: Diagnosis not present

## 2012-08-17 DIAGNOSIS — Z6825 Body mass index (BMI) 25.0-25.9, adult: Secondary | ICD-10-CM | POA: Diagnosis not present

## 2012-08-17 DIAGNOSIS — Z789 Other specified health status: Secondary | ICD-10-CM | POA: Diagnosis not present

## 2012-08-17 DIAGNOSIS — G8911 Acute pain due to trauma: Secondary | ICD-10-CM | POA: Diagnosis not present

## 2012-08-17 DIAGNOSIS — R0789 Other chest pain: Secondary | ICD-10-CM | POA: Diagnosis not present

## 2012-09-21 ENCOUNTER — Encounter: Payer: Self-pay | Admitting: Diagnostic Neuroimaging

## 2012-09-21 ENCOUNTER — Ambulatory Visit (INDEPENDENT_AMBULATORY_CARE_PROVIDER_SITE_OTHER): Payer: Medicare Other | Admitting: Diagnostic Neuroimaging

## 2012-09-21 VITALS — BP 175/84 | HR 73 | Temp 97.7°F | Ht 64.0 in | Wt 131.0 lb

## 2012-09-21 DIAGNOSIS — G20 Parkinson's disease: Secondary | ICD-10-CM

## 2012-09-21 NOTE — Patient Instructions (Signed)
Try to take carbidopa/levodopa and ropinirole doses three times per day (8am, 1pm, 6pm).  I will refer you to home physical therapy.  Use a rollator walker.

## 2012-09-21 NOTE — Progress Notes (Signed)
[facility]  PATIENT: Courtney Reed DOB: [birth year]  REFERRING CLINICIAN:  HISTORY FROM: patient REASON FOR VISIT: follow up   HISTORICAL  CHIEF COMPLAINT:  Chief Complaint  Patient presents with  . Follow-up    Last ov 03/04/2012. Parkinsonism's    HISTORY OF PRESENT ILLNESS:   UPDATE 09/21/12: Since last visit, taking ropinirole 2mg  TID and carb/levo (25/100) half tab TID. Tried full tab of carb/levo, but it makes her too sleepy. Fell down at home a few days ago. Now using 4 point cane.   UPDATE 03/04/12: Since last visit, taking ropinirole 2mg  TID consistently. Nausea has resolved. More tremor and balance difficulty. Has cut down on driving. She feels nervous with driving. She has had some panic attack episodes (chest pain, racing heart beat, sweating, shaking). Saw cardiology.  UPDATE 09/03/11: Feels stable on ropinirole 2mg  TID. Sometimes take 1mg  at noon, if she has AM nausea. Had cholecystectomy, and nausea has improved. No falls. Enjoys gardening.  UPDATE 03/19/11: Doing well. Some tremor in left hand now. Some balance diff.  UPDATE 11/16/10: Tolerating ropinirole 0.5mg  TID.  Tremor getting worse.  More freezing with gait.  Fell at home, but blames this on her shoe getting stuck outside.    UPDATE 08/15/10: Doing about the same; tried ropinirole 0.25 TID but had to reduce due to nausea.  Still with tremor and balance diff.  PRIOR HPI (05/16/10): 79 year old right-handed female with history of hypothyroidism, here for evaluation of tremor since 2011.  Patient reports gradual onset tremor, mainly in her right upper extremity, less in her left upper extremity, since 2011.  Tremor is most notable when she is relaxed, sitting and watching TV. The tremor improves and she is doing specific action such as using utensils or holding objects. Anxiety seems to worsen the tremor. She also reports balance difficulty. She fell down in the garden a few weeks ago.  She has had  a decrease in her smell and taste abilities over several years. She has no sleep problems. Denies hallucinations, voice tremor, head tremor, swallowing difficulties. No family history of tremor.  REVIEW OF SYSTEMS: Full 14 system review of systems performed and notable only for fall, dizziness.  ALLERGIES: Allergies  Allergen Reactions  . Sulfa Antibiotics     HOME MEDICATIONS: Prior to Admission medications   Medication Sig Start Date End Date Taking? Authorizing Provider  aspirin 81 MG tablet Take 81 mg by mouth daily.   Yes Historical Provider, MD  Calcium-Magnesium-Zinc (CALCIUM-MAGNESUIUM-ZINC PO) Take by mouth daily.   Yes Historical Provider, MD  carbidopa-levodopa (SINEMET IR) 25-100 MG per tablet Take 0.5 tablets by mouth 3 (three) times daily. 07/28/12  Yes Historical Provider, MD  estrogens, conjugated, (PREMARIN) 0.625 MG tablet Take 0.625 mg by mouth 2 (two) times a week. .   Yes Historical Provider, MD  ibuprofen (ADVIL,MOTRIN) 200 MG tablet Take 200 mg by mouth every 6 (six) hours as needed.   Yes Historical Provider, MD  levothyroxine (SYNTHROID, LEVOTHROID) 75 MCG tablet Take 75 mcg by mouth daily.   Yes Historical Provider, MD  Multiple Vitamins-Minerals (HAIR/SKIN/NAILS PO) Take by mouth.   Yes Historical Provider, MD  rOPINIRole (REQUIP) 1 MG tablet Take 2 mg by mouth 3 (three) times daily.  05/13/11  Yes Historical Provider, MD  ALPRAZolam Prudy Feeler) 0.25 MG tablet Take 0.25 mg by mouth at bedtime as needed for sleep.    Historical Provider, MD   Outpatient Prescriptions Prior to Visit  Medication Sig Dispense Refill  .  aspirin 81 MG tablet Take 81 mg by mouth daily.      . Calcium-Magnesium-Zinc (CALCIUM-MAGNESUIUM-ZINC PO) Take by mouth daily.      Marland Kitchen estrogens, conjugated, (PREMARIN) 0.625 MG tablet Take 0.625 mg by mouth 2 (two) times a week. .      . ibuprofen (ADVIL,MOTRIN) 200 MG tablet Take 200 mg by mouth every 6 (six) hours as needed.      Marland Kitchen levothyroxine  (SYNTHROID, LEVOTHROID) 75 MCG tablet Take 75 mcg by mouth daily.      . Multiple Vitamins-Minerals (HAIR/SKIN/NAILS PO) Take by mouth.      Marland Kitchen rOPINIRole (REQUIP) 1 MG tablet Take 2 mg by mouth 3 (three) times daily.        No facility-administered medications prior to visit.    PAST MEDICAL HISTORY: Past Medical History  Diagnosis Date  . Thyroid disease   . Hypothyroidism   . Nausea   . Gallstones   . Parkinson disease     PAST SURGICAL HISTORY: Past Surgical History  Procedure Laterality Date  . Thyroidectomy    . Hysterecomy - unknown type    . Appendectomy  1950  . Eye surgery      both cataracts  . Cholecystectomy  07/01/2011    Procedure: LAPAROSCOPIC CHOLECYSTECTOMY WITH INTRAOPERATIVE CHOLANGIOGRAM;  Surgeon: Clovis Pu. Cornett, MD;  Location: [facility];  Service: General;  Laterality: N/A;    FAMILY HISTORY: Family History  Problem Relation Age of Onset  . Heart attack    . Cancer    . Coronary artery disease    . Heart disease Father     Heart attack    SOCIAL HISTORY:  History   Social History  . Marital Status: Widowed    Spouse Name: N/A    Number of Children: 3  . Years of Education: N/A   Occupational History  . retired    Social History Main Topics  . Smoking status: Never Smoker   . Smokeless tobacco: Not on file  . Alcohol Use: No  . Drug Use: No  . Sexual Activity: Not on file   Other Topics Concern  . Not on file   Social History Narrative  . No narrative on file     PHYSICAL EXAM  Filed Vitals:   09/21/12 1359  BP: 175/84  Pulse: 73  Temp: 97.7 F (36.5 C)  TempSrc: Oral  Height: 5\' 4"  (1.626 m)  Weight: 131 lb (59.421 kg)    Not recorded    Body mass index is 22.47 kg/(m^2).   GENERAL EXAM: Patient is in no distress  CARDIOVASCULAR: Regular rate and rhythm, no murmurs, no carotid bruits  NEUROLOGIC: MENTAL STATUS: awake, alert, language fluent, comprehension intact, naming intact CRANIAL  NERVE: no papilledema on fundoscopic exam, pupils equal and reactive to light, visual fields full to confrontation, extraocular muscles intact, no nystagmus, facial sensation and strength symmetric, uvula midline, shoulder shrug symmetric, tongue midline. POSITIVE MYERSON'S. MOTOR: Normal bulk and tone.  RESTING TREMOR IN BUE (RUE > LUE). NO RIGIDITY.  BRADYKINESIA IN LUE AND LLE. Ffull strength in the BUE, BLE SENSORY: normal and symmetric to light touch COORDINATION: finger-nose-finger, fine finger movements, heel-shin normal REFLEXES: deep tendon reflexes TRACE; ABSENT AT ANKLES.  GAIT/STATION: BUE TREMOR WHILE WALKING.  SHORT STEPS. SLIGHT HESITATION WITH TURNS. STOOPED POSTURE.   DIAGNOSTIC DATA (LABS, IMAGING, TESTING) - I reviewed patient records, labs, notes, testing and imaging myself where available.  Lab Results  Component Value Date  WBC 8.0 06/28/2011   HGB 13.7 06/28/2011   HCT 40.1 06/28/2011   MCV 87.6 06/28/2011   PLT 227 06/28/2011      Component Value Date/Time   NA 133* 06/28/2011 1330   K 4.2 06/28/2011 1330   CL 96 06/28/2011 1330   CO2 28 06/28/2011 1330   GLUCOSE 110* 06/28/2011 1330   BUN 9 06/28/2011 1330   CREATININE 0.62 06/28/2011 1330   CALCIUM 9.4 06/28/2011 1330   PROT 7.0 06/28/2011 1330   ALBUMIN 3.8 06/28/2011 1330   AST 22 06/28/2011 1330   ALT 17 06/28/2011 1330   ALKPHOS 64 06/28/2011 1330   BILITOT 0.4 06/28/2011 1330   GFRNONAA 81* 06/28/2011 1330   GFRAA >90 06/28/2011 1330   No results found for this basename: CHOL, HDL, LDLCALC, LDLDIRECT, TRIG, CHOLHDL   Lab Results  Component Value Date   HGBA1C  Value: 5.9 (NOTE)   The ADA recommends the following therapeutic goal for glycemic   control related to Hgb A1C measurement:   Goal of Therapy:   < 7.0% Hgb A1C   Reference: American Diabetes Association: Clinical Practice   Recommendations 2008, Diabetes Care,  2008, 31:(Suppl 1). 03/10/2008   No results found for this basename: VITAMINB12   Lab Results    Component Value Date   TSH 2.131 *Test methodology is 3rd generation TSH* 03/10/2008    06/08/10 MRI brain - chronic stable changes of microvascular ischemia and mild generalized atrophy. No significant change compared with MRI 03/09/08.   ASSESSMENT AND PLAN  79 y.o. right-handed female with hypothyroidism, here with resting tremor, bradykinesia and postural instability.  Symptoms and exam are consistent with Parkinson's disease.  PLAN: 1. continue ropinirole 2mg  TID 2. Carb/levo 25/100 (HALF TAB tid; TRY TO INCREASE TO FULL TAB tid) 3. home PT eval for balance/gait   Return in about 6 months (around 03/21/2013) for with Heide Guile or Penumalli.    Suanne Marker, MD 09/21/2012, 2:46 PM Certified in Neurology, Neurophysiology and Neuroimaging  [facility] [address] [place], [place] [address] [phone]

## 2012-09-23 DIAGNOSIS — Z5189 Encounter for other specified aftercare: Secondary | ICD-10-CM | POA: Diagnosis not present

## 2012-09-23 DIAGNOSIS — G20 Parkinson's disease: Secondary | ICD-10-CM | POA: Diagnosis not present

## 2012-09-28 ENCOUNTER — Telehealth: Payer: Self-pay | Admitting: Diagnostic Neuroimaging

## 2012-09-28 DIAGNOSIS — Z5189 Encounter for other specified aftercare: Secondary | ICD-10-CM | POA: Diagnosis not present

## 2012-09-28 DIAGNOSIS — G20 Parkinson's disease: Secondary | ICD-10-CM | POA: Diagnosis not present

## 2012-09-29 DIAGNOSIS — G20 Parkinson's disease: Secondary | ICD-10-CM | POA: Diagnosis not present

## 2012-09-29 DIAGNOSIS — Z5189 Encounter for other specified aftercare: Secondary | ICD-10-CM | POA: Diagnosis not present

## 2012-09-29 NOTE — Telephone Encounter (Signed)
Order placed for OT as per request of [facility]

## 2012-09-30 DIAGNOSIS — Z5189 Encounter for other specified aftercare: Secondary | ICD-10-CM | POA: Diagnosis not present

## 2012-09-30 DIAGNOSIS — G20 Parkinson's disease: Secondary | ICD-10-CM | POA: Diagnosis not present

## 2012-10-01 DIAGNOSIS — G20 Parkinson's disease: Secondary | ICD-10-CM | POA: Diagnosis not present

## 2012-10-01 DIAGNOSIS — Z5189 Encounter for other specified aftercare: Secondary | ICD-10-CM | POA: Diagnosis not present

## 2012-10-07 DIAGNOSIS — G20 Parkinson's disease: Secondary | ICD-10-CM | POA: Diagnosis not present

## 2012-10-07 DIAGNOSIS — Z5189 Encounter for other specified aftercare: Secondary | ICD-10-CM | POA: Diagnosis not present

## 2012-10-09 DIAGNOSIS — G20 Parkinson's disease: Secondary | ICD-10-CM | POA: Diagnosis not present

## 2012-10-09 DIAGNOSIS — Z5189 Encounter for other specified aftercare: Secondary | ICD-10-CM | POA: Diagnosis not present

## 2012-10-13 DIAGNOSIS — Z5189 Encounter for other specified aftercare: Secondary | ICD-10-CM | POA: Diagnosis not present

## 2012-10-13 DIAGNOSIS — G20 Parkinson's disease: Secondary | ICD-10-CM | POA: Diagnosis not present

## 2012-11-03 DIAGNOSIS — Z23 Encounter for immunization: Secondary | ICD-10-CM | POA: Diagnosis not present

## 2012-11-09 ENCOUNTER — Encounter: Payer: Self-pay | Admitting: Cardiology

## 2012-11-18 ENCOUNTER — Ambulatory Visit (INDEPENDENT_AMBULATORY_CARE_PROVIDER_SITE_OTHER): Payer: Medicare Other | Admitting: Cardiovascular Disease

## 2012-11-18 ENCOUNTER — Encounter: Payer: Self-pay | Admitting: Cardiovascular Disease

## 2012-11-18 VITALS — BP 164/87 | HR 76 | Ht 62.0 in | Wt 138.0 lb

## 2012-11-18 DIAGNOSIS — I10 Essential (primary) hypertension: Secondary | ICD-10-CM | POA: Diagnosis not present

## 2012-11-18 DIAGNOSIS — R0609 Other forms of dyspnea: Secondary | ICD-10-CM

## 2012-11-18 DIAGNOSIS — R0600 Dyspnea, unspecified: Secondary | ICD-10-CM

## 2012-11-18 NOTE — Progress Notes (Signed)
Courtney Reed Date of Birth  [birth year]       [facility]    [facility] [address]  [address] [place], [place]  [address]   [place], [place]  [address] [phone]     [phone]   Fax  [phone]    Fax [phone]  Problem List: 1. Chest pain 2. Anxiety 3. Status post cholecystectomy 4. parkinsons disease  History of Present Illness:  Courtney Reed is an 79 year old female who was seen 2 years ago. She's had intermittent chest pains in the past. She been diagnosed with Parkinson's disease. She is recently status post cholecystectomy several months ago.  She presents with episodes of shortness of breath and occasional episodes of chest discomfort. She does not do any exercise. She has had a stroke and is somewhat limited in her memory.  Nov. 12, 2014;  Courtney Reed is doing well.   She is feeling well.  She is anxious today because her son brought her in late.  Her BP is typically well controlled.    Her Parkinsons disease is progression  Current Outpatient Prescriptions on File Prior to Visit  Medication Sig Dispense Refill  . aspirin 81 MG tablet Take 81 mg by mouth daily.      . Calcium-Magnesium-Zinc (CALCIUM-MAGNESUIUM-ZINC PO) Take by mouth daily.      . carbidopa-levodopa (SINEMET IR) 25-100 MG per tablet Take 0.5 tablets by mouth 3 (three) times daily.      Marland Kitchen estrogens, conjugated, (PREMARIN) 0.625 MG tablet Take 0.625 mg by mouth 2 (two) times a week. .      . ibuprofen (ADVIL,MOTRIN) 200 MG tablet Take 200 mg by mouth every 6 (six) hours as needed.      Marland Kitchen levothyroxine (SYNTHROID, LEVOTHROID) 75 MCG tablet Take 75 mcg by mouth daily.      . Multiple Vitamins-Minerals (HAIR/SKIN/NAILS PO) Take by mouth.      Marland Kitchen rOPINIRole (REQUIP) 1 MG tablet Take 2 mg by mouth 3 (three) times daily.       Marland Kitchen ALPRAZolam (XANAX) 0.25 MG tablet Take 0.25 mg by mouth at bedtime as needed for sleep.       No current facility-administered  medications on file prior to visit.    Allergies  Allergen Reactions  . Sulfa Antibiotics     Past Medical History  Diagnosis Date  . Thyroid disease   . Hypothyroidism   . Nausea   . Gallstones   . Parkinson disease     Past Surgical History  Procedure Laterality Date  . Thyroidectomy    . Hysterecomy - unknown type    . Appendectomy  1950  . Eye surgery      both cataracts  . Cholecystectomy  07/01/2011    Procedure: LAPAROSCOPIC CHOLECYSTECTOMY WITH INTRAOPERATIVE CHOLANGIOGRAM;  Surgeon: Clovis Pu. Cornett, MD;  Location: [facility];  Service: General;  Laterality: N/A;    History  Smoking status  . Never Smoker   Smokeless tobacco  . Not on file    History  Alcohol Use No    Family History  Problem Relation Age of Onset  . Heart attack    . Cancer    . Coronary artery disease    . Heart disease Father     Heart attack    Reviw of Systems:  Reviewed in the HPI.  All other systems are negative.  Physical Exam: Blood pressure 164/87, pulse 76, height 5\' 2"  (1.575 m),  weight 138 lb (62.596 kg). General: Well developed, well nourished, in no acute distress.  Head: Normocephalic, atraumatic, sclera non-icteric, mucus membranes are moist,   Neck: Supple. Carotids are 2 + without bruits. No JVD  Lungs: Clear bilaterally to auscultation.  Heart: regular rate.  normal  S1 S2. There is a soft systolic murmur.  Abdomen: Soft, non-tender, non-distended with normal bowel sounds. No hepatomegaly. No rebound/guarding. No masses.  Msk:  Strength and tone are normal  Extremities: No clubbing or cyanosis. No edema.  Distal pedal pulses are 2+ and equal bilaterally.  Neuro: Alert and oriented X 3. Moves all extremities spontaneously.  Psych:  Responds to questions appropriately with a normal affect.  ECG: 11/18/2011-normal sinus rhythm 69 beats a minute. She has nonspecific ST and T wave abnormalities.  Assessment / Plan:

## 2012-11-18 NOTE — Patient Instructions (Signed)
Your physician recommends that you continue on your current medications as directed. Please refer to the Current Medication list given to you today.  Your physician recommends that you schedule a follow-up appointment in: as needed basis 

## 2012-11-18 NOTE — Assessment & Plan Note (Signed)
She is doing well.  She has not had any cardiac problems.  i will have her see me on as needed.

## 2013-03-24 ENCOUNTER — Other Ambulatory Visit: Payer: Self-pay | Admitting: Diagnostic Neuroimaging

## 2013-03-24 ENCOUNTER — Encounter: Payer: Self-pay | Admitting: Diagnostic Neuroimaging

## 2013-03-24 ENCOUNTER — Ambulatory Visit (INDEPENDENT_AMBULATORY_CARE_PROVIDER_SITE_OTHER): Payer: Medicare Other | Admitting: Diagnostic Neuroimaging

## 2013-03-24 VITALS — BP 163/72 | HR 74 | Ht 64.0 in | Wt 143.0 lb

## 2013-03-24 DIAGNOSIS — G20 Parkinson's disease: Secondary | ICD-10-CM | POA: Insufficient documentation

## 2013-03-24 MED ORDER — ROPINIROLE HCL 1 MG PO TABS: 1.0000 mg | ORAL_TABLET | Freq: Three times a day (TID) | ORAL | Status: DC

## 2013-03-24 MED ORDER — CARBIDOPA-LEVODOPA 25-100 MG PO TABS: 1.0000 | ORAL_TABLET | Freq: Three times a day (TID) | ORAL | Status: DC

## 2013-03-24 NOTE — Patient Instructions (Signed)
Continue carb/levo and ropinirole.

## 2013-03-24 NOTE — Progress Notes (Signed)
[facility]  PATIENT: Courtney Reed DOB: Nov 03, 1926  REFERRING CLINICIAN:  HISTORY FROM: patient REASON FOR VISIT: follow up   HISTORICAL  CHIEF COMPLAINT:  Chief Complaint  Patient presents with  . Follow-up    PD    HISTORY OF PRESENT ILLNESS:   UPDATE 03/24/13: Since last visit, has been taking ropinirole 1mg  TID + carb/levo 25/100 1 tab TID. No on off fluctuations. No wearing off. Taking meds at 7am, 2:30pm, 7:30pm. Go to sleep at [facility] with mild benefit.  UPDATE 09/21/12: Since last visit, taking ropinirole 2mg  TID and carb/levo (25/100) half tab TID. Tried full tab of carb/levo, but it makes her too sleepy. Fell down at home a few days ago. Now using 4 point cane.   UPDATE 03/04/12: Since last visit, taking ropinirole 2mg  TID consistently. Nausea has resolved. More tremor and balance difficulty. Has cut down on driving. She feels nervous with driving. She has had some panic attack episodes (chest pain, racing heart beat, sweating, shaking). Saw cardiology.  UPDATE 09/03/11: Feels stable on ropinirole 2mg  TID. Sometimes take 1mg  at noon, if she has AM nausea. Had cholecystectomy, and nausea has improved. No falls. Enjoys gardening.  UPDATE 03/19/11: Doing well. Some tremor in left hand now. Some balance diff.  UPDATE 11/16/10: Tolerating ropinirole 0.5mg  TID.  Tremor getting worse.  More freezing with gait.  Fell at home, but blames this on her shoe getting stuck outside.    UPDATE 08/15/10: Doing about the same; tried ropinirole 0.25 TID but had to reduce due to nausea.  Still with tremor and balance diff.  PRIOR HPI (05/16/10): 80 year old right-handed female with history of hypothyroidism, here for evaluation of tremor since 2011. Patient reports gradual onset tremor, mainly in her right upper extremity, less in her left upper extremity, since 2011.  Tremor is most notable when she is relaxed, sitting and watching TV. The tremor improves and she  is doing specific action such as using utensils or holding objects. Anxiety seems to worsen the tremor. She also reports balance difficulty. She fell down in the garden a few weeks ago.  She has had a decrease in her smell and taste abilities over several years. She has no sleep problems. Denies hallucinations, voice tremor, head tremor, swallowing difficulties. No family history of tremor.  REVIEW OF SYSTEMS: Full 14 system review of systems performed and notable only for dizziness and tremor.  ALLERGIES: Allergies  Allergen Reactions  . Sulfa Antibiotics     HOME MEDICATIONS: Outpatient Prescriptions Prior to Visit  Medication Sig Dispense Refill  . aspirin 81 MG tablet Take 81 mg by mouth daily.      . Calcium-Magnesium-Zinc (CALCIUM-MAGNESUIUM-ZINC PO) Take by mouth daily.      Marland Kitchen ibuprofen (ADVIL,MOTRIN) 200 MG tablet Take 200 mg by mouth every 6 (six) hours as needed.      Marland Kitchen levothyroxine (SYNTHROID, LEVOTHROID) 75 MCG tablet Take 75 mcg by mouth daily.      . Multiple Vitamins-Minerals (HAIR/SKIN/NAILS PO) Take by mouth.      . carbidopa-levodopa (SINEMET IR) 25-100 MG per tablet Take 1 tablet by mouth 3 (three) times daily.       Marland Kitchen rOPINIRole (REQUIP) 1 MG tablet Take 1 mg by mouth 3 (three) times daily.       Marland Kitchen ALPRAZolam (XANAX) 0.25 MG tablet Take 0.25 mg by mouth at bedtime as needed for sleep.      Marland Kitchen estrogens, conjugated, (PREMARIN) 0.625 MG tablet  Take 0.625 mg by mouth 2 (two) times a week. .       No facility-administered medications prior to visit.    PAST MEDICAL HISTORY: Past Medical History  Diagnosis Date  . Thyroid disease   . Hypothyroidism   . Nausea   . Gallstones   . Parkinson disease     PAST SURGICAL HISTORY: Past Surgical History  Procedure Laterality Date  . Thyroidectomy    . Hysterecomy - unknown type    . Appendectomy  1950  . Eye surgery      both cataracts  . Cholecystectomy  07/01/2011    Procedure: LAPAROSCOPIC CHOLECYSTECTOMY WITH  INTRAOPERATIVE CHOLANGIOGRAM;  Surgeon: Joyice Faster. Cornett, MD;  Location: [facility];  Service: General;  Laterality: N/A;    FAMILY HISTORY: Family History  Problem Relation Age of Onset  . Heart attack    . Cancer    . Coronary artery disease    . Heart disease Father     Heart attack    SOCIAL HISTORY:  History   Social History  . Marital Status: Widowed    Spouse Name: N/A    Number of Children: 3  . Years of Education: N/A   Occupational History  . retired    Social History Main Topics  . Smoking status: Never Smoker   . Smokeless tobacco: Never Used  . Alcohol Use: No  . Drug Use: No  . Sexual Activity: Not on file   Other Topics Concern  . Not on file   Social History Narrative  . No narrative on file     PHYSICAL EXAM  Filed Vitals:   03/24/13 1351  BP: 163/72  Pulse: 74  Height: 5\' 4"  (1.626 m)  Weight: 143 lb (64.864 kg)    Not recorded    Body mass index is 24.53 kg/(m^2).   GENERAL EXAM: Patient is in no distress  CARDIOVASCULAR: Regular rate and rhythm, no murmurs, no carotid bruits  NEUROLOGIC: MENTAL STATUS: awake, alert, language fluent, comprehension intact, naming intact CRANIAL NERVE: no papilledema on fundoscopic exam, pupils equal and reactive to light, visual fields full to confrontation, extraocular muscles intact, no nystagmus, facial sensation and strength symmetric, uvula midline, shoulder shrug symmetric, tongue midline. POSITIVE MYERSON'S. MOTOR: Normal bulk and tone.  RESTING TREMOR IN BUE (RUE > LUE). NO RIGIDITY.  BRADYKINESIA IN LUE AND LLE. Ffull strength in the BUE, BLE SENSORY: normal and symmetric to light touch COORDINATION: finger-nose-finger, fine finger movements, heel-shin normal REFLEXES: deep tendon reflexes TRACE; ABSENT AT ANKLES.  GAIT/STATION: BUE TREMOR WHILE WALKING.  SHORT STEPS. SLIGHT HESITATION WITH TURNS. STOOPED POSTURE.   DIAGNOSTIC DATA (LABS, IMAGING, TESTING) - I reviewed  patient records, labs, notes, testing and imaging myself where available.  Lab Results  Component Value Date   WBC 8.0 06/28/2011   HGB 13.7 06/28/2011   HCT 40.1 06/28/2011   MCV 87.6 06/28/2011   PLT 227 06/28/2011      Component Value Date/Time   NA 133* 06/28/2011 1330   K 4.2 06/28/2011 1330   CL 96 06/28/2011 1330   CO2 28 06/28/2011 1330   GLUCOSE 110* 06/28/2011 1330   BUN 9 06/28/2011 1330   CREATININE 0.62 06/28/2011 1330   CALCIUM 9.4 06/28/2011 1330   PROT 7.0 06/28/2011 1330   ALBUMIN 3.8 06/28/2011 1330   AST 22 06/28/2011 1330   ALT 17 06/28/2011 1330   ALKPHOS 64 06/28/2011 1330   BILITOT 0.4 06/28/2011 1330   GFRNONAA  81* 06/28/2011 1330   GFRAA >90 06/28/2011 1330   No results found for this basename: CHOL,  HDL,  LDLCALC,  LDLDIRECT,  TRIG,  CHOLHDL   Lab Results  Component Value Date   HGBA1C  Value: 5.9 (NOTE)   The ADA recommends the following therapeutic goal for glycemic   control related to Hgb A1C measurement:   Goal of Therapy:   < 7.0% Hgb A1C   Reference: American Diabetes Association: Clinical Practice   Recommendations 2008, Diabetes Care,  2008, 31:(Suppl 1). 03/10/2008   No results found for this basename: VITAMINB12   Lab Results  Component Value Date   TSH 2.131 *Test methodology is 3rd generation TSH* 03/10/2008    06/08/10 MRI brain - chronic stable changes of microvascular ischemia and mild generalized atrophy. No significant change compared with MRI 03/09/08.   ASSESSMENT AND PLAN  80 y.o. right-handed female with hypothyroidism, here with resting tremor, bradykinesia and postural instability.  Symptoms and exam are consistent with Parkinson's disease. Overall stable.  PLAN: 1. continue ropinirole 1mg  TID + carb/levo 25/100 1 tab TID 2. Follow up with PCP re: BP today (BP: 163/72 mmHg)  Return in about 1 year (around 03/25/2014).    Penni Bombard, MD [id number], [id number] PM Certified in Neurology, Neurophysiology and Neuroimaging  [facility] [address] [place], [place] [address] [phone]

## 2013-03-25 ENCOUNTER — Telehealth: Payer: Self-pay | Admitting: Diagnostic Neuroimaging

## 2013-03-25 NOTE — Telephone Encounter (Signed)
Last OV note from 03/18 says the patient will continue Requip 1mg  TID.  Patient indicates this dose is not effective and would like to go back to 2mg  TID.  Okay to change back to this dose?  (I will be happy to send Rx)  Please advise.  Thank you.

## 2013-03-25 NOTE — Telephone Encounter (Signed)
Pt called regarding her rOPINIRole (REQUIP) 1 MG tablet prescription.  She states that she has been taking 2 mg but was prescribed the 1 mg. She said that she has been taking it and it has been helping. She also said that they did not discuss decreasing her dosage.  She asked if someone could check on this and send in the corrected 2 mg prescription to [facility] she would greatly appreciate it.  Please reference visit notes from 09-2012 which shows the 2 mg dosage.  Thank you

## 2013-03-25 NOTE — Telephone Encounter (Signed)
Pt called to check the status of this request.  She states that on the office notes that she has kept on 02-26-12 she was on the 1 mg and on 3-33-14 the dose was increased to 2 mg. She said that she hoped this would help.  Please call to advise.  Thank you

## 2013-03-26 ENCOUNTER — Telehealth: Payer: Self-pay

## 2013-03-26 MED ORDER — ROPINIROLE HCL 2 MG PO TABS: 2.0000 mg | ORAL_TABLET | Freq: Three times a day (TID) | ORAL | Status: DC

## 2013-03-26 NOTE — Telephone Encounter (Signed)
Rx has been changed to 2mg  and sent to the pharmacy.  I spoke with the patient, she is aware.

## 2013-03-26 NOTE — Telephone Encounter (Signed)
Patient calling again to check on the status, please call her back as soon as possible, she is very anxious.

## 2013-03-26 NOTE — Telephone Encounter (Signed)
Ok done. -VRP

## 2013-03-26 NOTE — Telephone Encounter (Signed)
Pt is down to her last pills for today and will not have any for the weekend. Pt is wanting to see if this can be called in and please call her home # and leave a message when this has been called in. Thanks

## 2013-03-26 NOTE — Telephone Encounter (Signed)
Patient is requesting a Rx refill for Requip 2mg  TID, as she indicates 1mg  TID is not effective.  Thank you.

## 2013-03-26 NOTE — Telephone Encounter (Signed)
I spoke with the patient.  She will pick up the Rx for 1mg  that was sent at [facility] on 03/18, and if Rx gets changed to 2mg , she is aware we will send a new prescription to the pharmacy.

## 2013-04-01 DIAGNOSIS — E039 Hypothyroidism, unspecified: Secondary | ICD-10-CM | POA: Diagnosis not present

## 2013-04-01 DIAGNOSIS — Z79899 Other long term (current) drug therapy: Secondary | ICD-10-CM | POA: Diagnosis not present

## 2013-04-01 DIAGNOSIS — G20 Parkinson's disease: Secondary | ICD-10-CM | POA: Diagnosis not present

## 2013-04-01 DIAGNOSIS — Z23 Encounter for immunization: Secondary | ICD-10-CM | POA: Diagnosis not present

## 2013-04-01 DIAGNOSIS — N951 Menopausal and female climacteric states: Secondary | ICD-10-CM | POA: Diagnosis not present

## 2013-04-01 DIAGNOSIS — I7300 Raynaud's syndrome without gangrene: Secondary | ICD-10-CM | POA: Diagnosis not present

## 2013-04-01 DIAGNOSIS — M159 Polyosteoarthritis, unspecified: Secondary | ICD-10-CM | POA: Diagnosis not present

## 2013-04-01 DIAGNOSIS — I671 Cerebral aneurysm, nonruptured: Secondary | ICD-10-CM | POA: Diagnosis not present

## 2013-04-01 DIAGNOSIS — F411 Generalized anxiety disorder: Secondary | ICD-10-CM | POA: Diagnosis not present

## 2013-04-22 ENCOUNTER — Telehealth: Payer: Self-pay | Admitting: Diagnostic Neuroimaging

## 2013-04-22 MED ORDER — CARBIDOPA-LEVODOPA 25-100 MG PO TABS: 1.0000 | ORAL_TABLET | Freq: Three times a day (TID) | ORAL | Status: DC

## 2013-04-22 NOTE — Telephone Encounter (Signed)
Patient calling with questions about her carbidopa levodopa medication, please call and advise patient.

## 2013-04-22 NOTE — Telephone Encounter (Signed)
Patient calling with questions regarding her Carbidopa Levodopa medication. Please call patient and advise, last phone encounter was closed by accident.

## 2013-04-22 NOTE — Telephone Encounter (Signed)
I called and spoke with the patient.  She would like the Carb/Levo resent for #180 instead so she can get the meds at the same time.  She would like the Rx noted to save on file, as she does not need it now.  Told her we will gladly take care of this.

## 2013-06-08 DIAGNOSIS — H04129 Dry eye syndrome of unspecified lacrimal gland: Secondary | ICD-10-CM | POA: Diagnosis not present

## 2013-11-05 DIAGNOSIS — Z23 Encounter for immunization: Secondary | ICD-10-CM | POA: Diagnosis not present

## 2014-03-28 ENCOUNTER — Ambulatory Visit (INDEPENDENT_AMBULATORY_CARE_PROVIDER_SITE_OTHER): Payer: Medicare Other | Admitting: Diagnostic Neuroimaging

## 2014-03-28 ENCOUNTER — Encounter: Payer: Self-pay | Admitting: Diagnostic Neuroimaging

## 2014-03-28 VITALS — BP 161/75 | HR 76 | Temp 97.9°F | Ht 64.0 in | Wt 141.5 lb

## 2014-03-28 DIAGNOSIS — G20 Parkinson's disease: Secondary | ICD-10-CM | POA: Diagnosis not present

## 2014-03-28 MED ORDER — CARBIDOPA-LEVODOPA 25-100 MG PO TABS: 1.0000 | ORAL_TABLET | Freq: Three times a day (TID) | ORAL | Status: DC

## 2014-03-28 MED ORDER — ROPINIROLE HCL 2 MG PO TABS: 2.0000 mg | ORAL_TABLET | Freq: Three times a day (TID) | ORAL | Status: DC

## 2014-03-28 NOTE — Progress Notes (Signed)
[facility]  PATIENT: Courtney Reed DOB: 1926/08/22  REFERRING CLINICIAN:  HISTORY FROM: patient REASON FOR VISIT: follow up   HISTORICAL  CHIEF COMPLAINT:  No chief complaint on file.   HISTORY OF PRESENT ILLNESS:   UPDATE 03/28/14: Since last visit, no falls. Tolerating meds. Tremor stable. Posture more stooped. Still living independently, driving, taking care of her ADLs.  UPDATE 03/24/13: Since last visit, has been taking ropinirole 1mg  TID + carb/levo 25/100 1 tab TID. No on off fluctuations. No wearing off. Taking meds at 7am, 2:30pm, 7:30pm. Go to sleep at [facility] with mild benefit.  UPDATE 09/21/12: Since last visit, taking ropinirole 2mg  TID and carb/levo (25/100) half tab TID. Tried full tab of carb/levo, but it makes her too sleepy. Fell down at home a few days ago. Now using 4 point cane.   UPDATE 03/04/12: Since last visit, taking ropinirole 2mg  TID consistently. Nausea has resolved. More tremor and balance difficulty. Has cut down on driving. She feels nervous with driving. She has had some panic attack episodes (chest pain, racing heart beat, sweating, shaking). Saw cardiology.  UPDATE 09/03/11: Feels stable on ropinirole 2mg  TID. Sometimes take 1mg  at noon, if she has AM nausea. Had cholecystectomy, and nausea has improved. No falls. Enjoys gardening.  UPDATE 03/19/11: Doing well. Some tremor in left hand now. Some balance diff.  UPDATE 11/16/10: Tolerating ropinirole 0.5mg  TID.  Tremor getting worse.  More freezing with gait.  Fell at home, but blames this on her shoe getting stuck outside.    UPDATE 08/15/10: Doing about the same; tried ropinirole 0.25 TID but had to reduce due to nausea.  Still with tremor and balance diff.  PRIOR HPI (05/16/10): 81 year old right-handed female with history of hypothyroidism, here for evaluation of tremor since 2011. Patient reports gradual onset tremor, mainly in her right upper extremity, less in her  left upper extremity, since 2011.  Tremor is most notable when she is relaxed, sitting and watching TV. The tremor improves and she is doing specific action such as using utensils or holding objects. Anxiety seems to worsen the tremor. She also reports balance difficulty. She fell down in the garden a few weeks ago.  She has had a decrease in her smell and taste abilities over several years. She has no sleep problems. Denies hallucinations, voice tremor, head tremor, swallowing difficulties. No family history of tremor.  REVIEW OF SYSTEMS: Full 14 system review of systems performed and notable only for dizziness and tremor.  ALLERGIES: Allergies  Allergen Reactions  . Sulfa Antibiotics     HOME MEDICATIONS: Outpatient Prescriptions Prior to Visit  Medication Sig Dispense Refill  . aspirin 81 MG tablet Take 81 mg by mouth daily.    . Calcium-Magnesium-Zinc (CALCIUM-MAGNESUIUM-ZINC PO) Take by mouth daily.    Marland Kitchen ibuprofen (ADVIL,MOTRIN) 200 MG tablet Take 200 mg by mouth every 6 (six) hours as needed.    Marland Kitchen levothyroxine (SYNTHROID, LEVOTHROID) 75 MCG tablet Take 75 mcg by mouth daily.    . Multiple Vitamins-Minerals (CENTRUM SILVER ULTRA WOMENS PO) Take by mouth.    . Multiple Vitamins-Minerals (HAIR/SKIN/NAILS PO) Take by mouth.    . triamcinolone lotion (KENALOG) 0.1 %     . carbidopa-levodopa (SINEMET IR) 25-100 MG per tablet Take 1 tablet by mouth 3 (three) times daily. 180 tablet 12  . rOPINIRole (REQUIP) 2 MG tablet Take 1 tablet (2 mg total) by mouth 3 (three) times daily. 180 tablet 12  No facility-administered medications prior to visit.    PAST MEDICAL HISTORY: Past Medical History  Diagnosis Date  . Thyroid disease   . Hypothyroidism   . Nausea   . Gallstones   . Parkinson disease     PAST SURGICAL HISTORY: Past Surgical History  Procedure Laterality Date  . Thyroidectomy    . Hysterecomy - unknown type    . Appendectomy  1950  . Eye surgery      both cataracts  .  Cholecystectomy  07/01/2011    Procedure: LAPAROSCOPIC CHOLECYSTECTOMY WITH INTRAOPERATIVE CHOLANGIOGRAM;  Surgeon: Joyice Faster. Cornett, MD;  Location: [facility];  Service: General;  Laterality: N/A;    FAMILY HISTORY: Family History  Problem Relation Age of Onset  . Heart attack    . Cancer    . Coronary artery disease    . Heart disease Father     Heart attack    SOCIAL HISTORY:  History   Social History  . Marital Status: Widowed    Spouse Name: N/A  . Number of Children: 3  . Years of Education: N/A   Occupational History  . retired    Social History Main Topics  . Smoking status: Never Smoker   . Smokeless tobacco: Never Used  . Alcohol Use: No  . Drug Use: No  . Sexual Activity: Not on file   Other Topics Concern  . Not on file   Social History Narrative     PHYSICAL EXAM  Filed Vitals:   03/28/14 1447  BP: 161/75  Pulse: 76  Temp: 97.9 F (36.6 C)  Height: 5\' 4"  (1.626 m)  Weight: 141 lb 8 oz (64.184 kg)    Not recorded      Body mass index is 24.28 kg/(m^2).   GENERAL EXAM: Patient is in no distress  CARDIOVASCULAR: Regular rate and rhythm, no murmurs, no carotid bruits  NEUROLOGIC: MENTAL STATUS: awake, alert, language fluent, comprehension intact, naming intact CRANIAL NERVE:  pupils equal and reactive to light, visual fields full to confrontation, extraocular muscles intact, no nystagmus, facial sensation and strength symmetric, uvula midline, shoulder shrug symmetric, tongue midline. POSITIVE MYERSON'S. MOTOR: Normal bulk and tone.  RESTING TREMOR IN BUE (RUE > LUE). NO RIGIDITY. MILD MOUTH TREMOR. BRADYKINESIA IN LUE AND LLE. Full strength in the BUE, BLE SENSORY: normal and symmetric to light touch COORDINATION: finger-nose-finger, fine finger movements, heel-shin normal REFLEXES: deep tendon reflexes TRACE; ABSENT AT ANKLES.  GAIT/STATION: BUE TREMOR WHILE WALKING. SHORT STEPS. SLIGHT HESITATION WITH TURNS. STOOPED  POSTURE.   DIAGNOSTIC DATA (LABS, IMAGING, TESTING) - I reviewed patient records, labs, notes, testing and imaging myself where available.  Lab Results  Component Value Date   WBC 8.0 06/28/2011   HGB 13.7 06/28/2011   HCT 40.1 06/28/2011   MCV 87.6 06/28/2011   PLT 227 06/28/2011      Component Value Date/Time   NA 133* 06/28/2011 1330   K 4.2 06/28/2011 1330   CL 96 06/28/2011 1330   CO2 28 06/28/2011 1330   GLUCOSE 110* 06/28/2011 1330   BUN 9 06/28/2011 1330   CREATININE 0.62 06/28/2011 1330   CALCIUM 9.4 06/28/2011 1330   PROT 7.0 06/28/2011 1330   ALBUMIN 3.8 06/28/2011 1330   AST 22 06/28/2011 1330   ALT 17 06/28/2011 1330   ALKPHOS 64 06/28/2011 1330   BILITOT 0.4 06/28/2011 1330   GFRNONAA 81* 06/28/2011 1330   GFRAA >90 06/28/2011 1330   No results found for: CHOL Lab  Results  Component Value Date   HGBA1C  03/10/2008    5.9 (NOTE)   The ADA recommends the following therapeutic goal for glycemic   control related to Hgb A1C measurement:   Goal of Therapy:   < 7.0% Hgb A1C   Reference: American Diabetes Association: Clinical Practice   Recommendations 2008, Diabetes Care,  2008, 31:(Suppl 1).   No results found for: VITAMINB12 Lab Results  Component Value Date   TSH 2.131 *Test methodology is 3rd generation TSH* 03/10/2008    06/08/10 MRI brain - chronic stable changes of microvascular ischemia and mild generalized atrophy. No significant change compared with MRI 03/09/08.   ASSESSMENT AND PLAN  81 y.o. right-handed female with hypothyroidism, here with resting tremor, bradykinesia and postural instability.  Symptoms and exam are consistent with Parkinson's disease. Overall stable.  PLAN: 1. continue ropinirole 1mg  TID + carb/levo 25/100 1 tab TID 2. PT evaluation 3. Follow up with PCP re: BP  Return in about 1 year (around 03/28/2015).    Penni Bombard, MD 02/25/7586, 3:25 PM Certified in Neurology, Neurophysiology and Neuroimaging  [facility] [address], [place], [place] [address] [phone]

## 2014-04-04 DIAGNOSIS — F419 Anxiety disorder, unspecified: Secondary | ICD-10-CM | POA: Diagnosis not present

## 2014-04-04 DIAGNOSIS — E039 Hypothyroidism, unspecified: Secondary | ICD-10-CM | POA: Diagnosis not present

## 2014-04-04 DIAGNOSIS — N951 Menopausal and female climacteric states: Secondary | ICD-10-CM | POA: Diagnosis not present

## 2014-04-04 DIAGNOSIS — G20 Parkinson's disease: Secondary | ICD-10-CM | POA: Diagnosis not present

## 2014-04-04 DIAGNOSIS — M150 Primary generalized (osteo)arthritis: Secondary | ICD-10-CM | POA: Diagnosis not present

## 2014-05-04 ENCOUNTER — Ambulatory Visit: Payer: Medicare Other | Attending: Diagnostic Neuroimaging | Admitting: Physical Therapy

## 2014-05-04 DIAGNOSIS — R293 Abnormal posture: Secondary | ICD-10-CM | POA: Diagnosis not present

## 2014-05-04 DIAGNOSIS — R258 Other abnormal involuntary movements: Secondary | ICD-10-CM | POA: Diagnosis not present

## 2014-05-04 DIAGNOSIS — R269 Unspecified abnormalities of gait and mobility: Secondary | ICD-10-CM | POA: Diagnosis not present

## 2014-05-04 NOTE — Therapy (Signed)
[facility] [address] [place], [place], [address] Phone: [phone]   Fax:  [phone]  Physical Therapy Evaluation  Patient Details  Name: Courtney Reed MRN: [id number] Date of Birth: 1926/10/05 Referring Provider:  Penni Bombard, MD  Encounter Date: 05/04/2014      PT End of Session - 05/04/14 2211    Visit Number 1   Number of Visits 17   Date for PT Re-Evaluation 07/03/14   PT Start Time 4496   PT Stop Time 1230   PT Time Calculation (min) 36 min   Activity Tolerance Patient tolerated treatment well      Past Medical History  Diagnosis Date  . Thyroid disease   . Hypothyroidism   . Nausea   . Gallstones   . Parkinson disease     Past Surgical History  Procedure Laterality Date  . Thyroidectomy    . Hysterecomy - unknown type    . Appendectomy  1950  . Eye surgery      both cataracts  . Cholecystectomy  07/01/2011    Procedure: LAPAROSCOPIC CHOLECYSTECTOMY WITH INTRAOPERATIVE CHOLANGIOGRAM;  Surgeon: Joyice Faster. Cornett, MD;  Location: [facility];  Service: General;  Laterality: N/A;    There were no vitals filed for this visit.  Visit Diagnosis:  Postural instability  Posture abnormality  Bradykinesia  Abnormality of gait      Subjective Assessment - 05/04/14 1156    Subjective Pt is an 81 year old female who presents to OP PT with Parkinson's disease.  She feels PD is progressing, but she continues to desire to be active.  Pt notices bilateral tremors, increasing balance difficulty.  She has not fallen in the past 6 months.  She does not use assistive device.  She typically uses UE support for sit<>stand.   Patient Stated Goals Pt wants to remain independent as long as possible   Currently in Pain? Yes   Pain Score 6   at worst   Pain Location Back   Pain Descriptors / Indicators Aching   Pain Type Chronic pain   Pain Onset More than a month ago   Pain  Frequency Intermittent   Aggravating Factors  working too much in the yard   Pain Relieving Factors lying down on back and rolling tennis ball along muscles            [facility] PT Assessment - 05/04/14 1200    Assessment   Medical Diagnosis Parkinson's disease   Onset Date --  2011-2012; onset of balance changes in past year   Precautions   Precautions Fall   Balance Screen   Has the patient fallen in the past 6 months No   Has the patient had a decrease in activity level because of a fear of falling?  No  Carries cell phone when outdoors   Is the patient reluctant to leave their home because of a fear of falling?  No   Home Environment   Living Enviornment Private residence   Living Arrangements Alone   Available Help at Discharge Family  lives nearby   Type of [place] to enter   Entrance Stairs-Number of Steps 2   [place] One level   [place] - single point   Prior Function   Level of Independence Independent with basic ADLs;Independent with homemaking with ambulation;Independent with gait;Independent with transfers   Leisure Enjoys gardening  and yardwork   Observation/Other Assessments   Focus on Therapeutic Outcomes (FOTO)  Functional Intake status 52; neuro QOL lower extremity 34.3   ROM / Strength   AROM / PROM / Strength Strength   Strength   Overall Strength Comments grossly tested at least 4/5 bilateral lower extremities   Transfers   Transfers Sit to Stand;Stand to Sit   Sit to Stand 6: Modified independent (Device/Increase time);With upper extremity assist;Without upper extremity assist;From chair/3-in-1;Five times sit to stand  5x sit<>stand:16.62 sec   Stand to Sit 6: Modified independent (Device/Increase time);With upper extremity assist;Without upper extremity assist;To chair/3-in-1   Ambulation/Gait   Ambulation/Gait Yes   Ambulation/Gait Assistance 5: Supervision   Ambulation Distance  (Feet) 200 Feet   Assistive device None   Gait Pattern Decreased step length - right;Decreased step length - left;Trunk flexed;Narrow base of support  decreased arm swing   Ambulation Surface Level;Indoor   Gait velocity 14.03 sec=2.34 ft/sec   Gait velocity - backwards 12.84 sec in 10 ft=0.79 ft/sec   Standardized Balance Assessment   Standardized Balance Assessment Timed Up and Go Test   Timed Up and Go Test   TUG Normal TUG;Manual TUG;Cognitive TUG   Normal TUG (seconds) 23.61   Manual TUG (seconds) 15.99   Cognitive TUG (seconds) 19.33   Functional Gait  Assessment   Gait assessed  Yes   Gait Level Surface Walks 20 ft, slow speed, abnormal gait pattern, evidence for imbalance or deviates 10-15 in outside of the 12 in walkway width. Requires more than 7 sec to ambulate 20 ft.  9.97 sec   Change in Gait Speed Able to change speed, demonstrates mild gait deviations, deviates 6-10 in outside of the 12 in walkway width, or no gait deviations, unable to achieve a major change in velocity, or uses a change in velocity, or uses an assistive device.   Gait with Horizontal Head Turns Performs head turns smoothly with slight change in gait velocity (eg, minor disruption to smooth gait path), deviates 6-10 in outside 12 in walkway width, or uses an assistive device.   Gait with Vertical Head Turns Performs task with moderate change in gait velocity, slows down, deviates 10-15 in outside 12 in walkway width but recovers, can continue to walk.   Gait and Pivot Turn Pivot turns safely in greater than 3 sec and stops with no loss of balance, or pivot turns safely within 3 sec and stops with mild imbalance, requires small steps to catch balance.   Step Over Obstacle Is able to step over one shoe box (4.5 in total height) but must slow down and adjust steps to clear box safely. May require verbal cueing.   Gait with Narrow Base of Support Ambulates less than 4 steps heel to toe or cannot perform without  assistance.   Gait with Eyes Closed Cannot walk 20 ft without assistance, severe gait deviations or imbalance, deviates greater than 15 in outside 12 in walkway width or will not attempt task.   Ambulating Backwards Walks 20 ft, slow speed, abnormal gait pattern, evidence for imbalance, deviates 10-15 in outside 12 in walkway width.   Steps Alternating feet, must use rail.   Total Score 12                             PT Short Term Goals - 05/04/14 2215    PT SHORT TERM GOAL #1   Title Pt will be be  independent with HEP for improved balance, transfers, and gait.  Target 06/03/14   Time 4   Period Weeks   Status New   PT SHORT TERM GOAL #2   Title Pt will improve Timed Up and Go score to less than or equal to 18 seconds for decreased fall risk.   Time 4   Period Weeks   Status New   PT SHORT TERM GOAL #3   Title Pt will improve Functional Gait Assessment score to at least 17/30 for decreased fall risk.    Time 4   Period Weeks   Status New   PT SHORT TERM GOAL #4   Title Pt will perform 5x sit<>stand test in less than or equal to 15 seconds for improved efficiency and safety with transfers.   Time 4   Period Weeks   Status New   PT SHORT TERM GOAL #5   Title Pt will verbalize understanding of local Parkinson's disease resources.   Time 4   Period Weeks   Status New           PT Long Term Goals - Jun 03, 2014 2217-05-06    PT LONG TERM GOAL #1   Title Pt will verbalize understanding of fall prevention techniques within the home environment. Target 07/03/14   Time 8   Period Weeks   Status New   PT LONG TERM GOAL #2   Title Pt will improve TUG score to less than or equal to 13.5 seconds for decreased fall risk.   Time 8   Period Weeks   Status New   PT LONG TERM GOAL #3   Title Pt will improve Functional Gait Assessment to at least 22/30 for decreased fall risk.   Time 8   Period Weeks   Status New   PT LONG TERM GOAL #4   Title Pt will improve gait  velocity to at least 2.62 ft/sec for improved gait efficiency and safety.   Time 8   Period Weeks   Status New   PT LONG TERM GOAL #5   Title Pt will verbalize understanding of continued community fitness upon D/C from PT.   Time 8   Period Weeks   Status New               Plan - Jun 03, 2014 2209-05-06    Clinical Impression Statement Pt is an 81 year old female who presents to OP PT with Parkinson's disease, with decreased balance, decreased functional strength, postural instability, postural changes, decreased timing and coordination with gait and turns.  Pt is at fall risk per TUG score ot 23.61 seconds, and Functional GAit Assessment score of 12/30.  Pt also has slowed gait speed.  Pt would benefit from further skilled PT to address strength, balance, transfers, and gait for overall improved functional mobility and decreased fall risk.   Pt will benefit from skilled therapeutic intervention in order to improve on the following deficits Abnormal gait;Decreased balance;Decreased mobility;Difficulty walking;Decreased strength;Postural dysfunction   Rehab Potential Good   PT Frequency 2x / week   PT Duration 8 weeks  plus eval   PT Treatment/Interventions ADLs/Self Care Home Management;Gait training;Functional mobility training;Therapeutic activities;Neuromuscular re-education;Balance training;Therapeutic exercise;Patient/family education   PT Next Visit Plan Initiate HEP-PWR! Moves in sitting and standing, transfers, gait activities   Consulted and Agree with Plan of Care Patient          G-Codes - Jun 03, 2014 2220/05/06    Functional Assessment Tool Used Functional Gait Assessment 12/30, TUG 23.61  sec, TUG cog 19.33 sec, TUG man 15.99 sec, gait velocity 2.34 ft/sec   Functional Limitation Mobility: Walking and moving around   Mobility: Walking and Moving Around Current Status [phone]) At least 20 percent but less than 40 percent impaired, limited or restricted   Mobility: Walking and Moving  Around Goal Status [phone]) At least 1 percent but less than 20 percent impaired, limited or restricted       Problem List Patient Active Problem List   Diagnosis Date Noted  . Parkinson's disease 03/24/2013  . Chest discomfort 11/18/2011  . Dyspnea 11/18/2011    MARRIOTT,AMY W. 05/04/2014, 10:24 PM  Mady Haagensen, PT 05/04/2014 10:24 PM Phone: [phone] Fax: [facility] [address] [place], [place], [address] Phone: [phone]   Fax:  [phone]

## 2014-05-10 ENCOUNTER — Ambulatory Visit: Payer: Medicare Other | Attending: Diagnostic Neuroimaging | Admitting: Physical Therapy

## 2014-05-10 DIAGNOSIS — R269 Unspecified abnormalities of gait and mobility: Secondary | ICD-10-CM | POA: Diagnosis not present

## 2014-05-10 DIAGNOSIS — R293 Abnormal posture: Secondary | ICD-10-CM | POA: Diagnosis not present

## 2014-05-10 DIAGNOSIS — R258 Other abnormal involuntary movements: Secondary | ICD-10-CM | POA: Diagnosis not present

## 2014-05-10 NOTE — Therapy (Signed)
[facility] [address] [place], [place], [address] Phone: [phone]   Fax:  [phone]  Physical Therapy Treatment  Patient Details  Name: Courtney Reed MRN: [id number] Date of Birth: [birth year] Referring Provider:  Alroy Dust, L.Marlou Sa, MD  Encounter Date: 05/10/2014      PT End of Session - 05/10/14 2107    Visit Number 2   Number of Visits 17   Date for PT Re-Evaluation 07/03/14   PT Start Time 1020   PT Stop Time 1059   PT Time Calculation (min) 39 min   Activity Tolerance Patient tolerated treatment well      Past Medical History  Diagnosis Date  . Thyroid disease   . Hypothyroidism   . Nausea   . Gallstones   . Parkinson disease     Past Surgical History  Procedure Laterality Date  . Thyroidectomy    . Hysterecomy - unknown type    . Appendectomy  1950  . Eye surgery      both cataracts  . Cholecystectomy  07/01/2011    Procedure: LAPAROSCOPIC CHOLECYSTECTOMY WITH INTRAOPERATIVE CHOLANGIOGRAM;  Surgeon: Joyice Faster. Cornett, MD;  Location: [facility];  Service: General;  Laterality: N/A;    There were no vitals filed for this visit.  Visit Diagnosis:  Posture abnormality  Bradykinesia      Subjective Assessment - 05/10/14 1022    Subjective No changes; woke up today a bit more anxious due to knowing I had to drive over here today.   Currently in Pain? No/denies                            PWR [facility]) - 05/10/14 1023    PWR! exercises Moves in sitting;Moves in standing   PWR! Up 10   PWR! Rock 10   PWR! Twist 10   PWR Step 10   Comments Cues for technique and large, deliberate movement   PWR! Up 10   PWR! Rock 10   PWR! Twist 10   PWR! Step 10   Comments cues for technique, large, deliberate movements             PT Education - 05/10/14 2106    Education provided Yes   Education Details HEP-PWR! Moves in sitting and standing   Person(s)  Educated Patient   Methods Explanation;Demonstration;Handout   Comprehension Verbalized understanding;Returned demonstration          PT Short Term Goals - 05/04/14 2215    PT SHORT TERM GOAL #1   Title Pt will be be independent with HEP for improved balance, transfers, and gait.  Target 06/03/14   Time 4   Period Weeks   Status New   PT SHORT TERM GOAL #2   Title Pt will improve Timed Up and Go score to less than or equal to 18 seconds for decreased fall risk.   Time 4   Period Weeks   Status New   PT SHORT TERM GOAL #3   Title Pt will improve Functional Gait Assessment score to at least 17/30 for decreased fall risk.    Time 4   Period Weeks   Status New   PT SHORT TERM GOAL #4   Title Pt will perform 5x sit<>stand test in less than or equal to 15 seconds for improved efficiency and safety with transfers.   Time 4   Period Weeks   Status New  PT SHORT TERM GOAL #5   Title Pt will verbalize understanding of local Parkinson's disease resources.   Time 4   Period Weeks   Status New           PT Long Term Goals - 05/04/14 2219    PT LONG TERM GOAL #1   Title Pt will verbalize understanding of fall prevention techniques within the home environment. Target 07/03/14   Time 8   Period Weeks   Status New   PT LONG TERM GOAL #2   Title Pt will improve TUG score to less than or equal to 13.5 seconds for decreased fall risk.   Time 8   Period Weeks   Status New   PT LONG TERM GOAL #3   Title Pt will improve Functional Gait Assessment to at least 22/30 for decreased fall risk.   Time 8   Period Weeks   Status New   PT LONG TERM GOAL #4   Title Pt will improve gait velocity to at least 2.62 ft/sec for improved gait efficiency and safety.   Time 8   Period Weeks   Status New   PT LONG TERM GOAL #5   Title Pt will verbalize understanding of continued community fitness upon D/C from PT.   Time 8   Period Weeks   Status New               Plan - 05/10/14 2109     Clinical Impression Statement Initiated HEP today to address posture, balance, trunk mobility, and step initiation in sitting and standing.  Pt performs exercises well, needing cues for large amplitude movements.   Pt will benefit from skilled therapeutic intervention in order to improve on the following deficits Abnormal gait;Decreased balance;Decreased mobility;Difficulty walking;Decreased strength;Postural dysfunction   Rehab Potential Good   PT Frequency 2x / week   PT Duration 8 weeks  wk 1 of 8   PT Treatment/Interventions ADLs/Self Care Home Management;Gait training;Functional mobility training;Therapeutic activities;Neuromuscular re-education;Balance training;Therapeutic exercise;Patient/family education   PT Next Visit Plan Review HEP; continue working on PWR! Moves in sitting and standing; work on transfer, posture and gait   Consulted and Agree with Plan of Care Patient        Problem List Patient Active Problem List   Diagnosis Date Noted  . Parkinson's disease 03/24/2013  . Chest discomfort 11/18/2011  . Dyspnea 11/18/2011    Graysen Depaula W. 05/10/2014, 9:17 PM  Frazier Butt., PT  [facility] [address] [place], [place], [address] Phone: [phone]   Fax:  [phone]

## 2014-05-10 NOTE — Patient Instructions (Signed)
Provided patient with PWR! Moves Basic 4 in Sitting and Standing  -To be performed 10-20 reps, 1-2 times per day

## 2014-05-25 ENCOUNTER — Ambulatory Visit: Payer: Medicare Other | Admitting: Physical Therapy

## 2014-05-25 DIAGNOSIS — R269 Unspecified abnormalities of gait and mobility: Secondary | ICD-10-CM

## 2014-05-25 DIAGNOSIS — R293 Abnormal posture: Secondary | ICD-10-CM | POA: Diagnosis not present

## 2014-05-25 DIAGNOSIS — R258 Other abnormal involuntary movements: Secondary | ICD-10-CM | POA: Diagnosis not present

## 2014-05-25 NOTE — Patient Instructions (Signed)
Walking Program  *Start with walking 5 minutes and resting for 5 minutes.  Do this 3 times a day if possible.  *After you have done this for a week, increase your time to 6 minutes walking and 4 minutes of rest.  *Increase by 1 minute each week until you are up to 10 minutes total  *Remember your best posture and arm swing and taking bigger steps  *Do this 5 days a week

## 2014-05-25 NOTE — Therapy (Signed)
[facility] [address] [place], [place], [address] Phone: [phone]   Fax:  [phone]  Physical Therapy Treatment  Patient Details  Name: Courtney Reed MRN: [id number] Date of Birth: August 10, 1926 Referring Provider:  Alroy Dust, L.Marlou Sa, MD  Encounter Date: 05/25/2014      PT End of Session - 05/25/14 1249    Visit Number 3   Number of Visits 17   Date for PT Re-Evaluation 07/03/14   PT Start Time 7341   PT Stop Time 1231   PT Time Calculation (min) 43 min   Activity Tolerance Patient tolerated treatment well   Behavior During Therapy [facility] for tasks assessed/performed      Past Medical History  Diagnosis Date  . Thyroid disease   . Hypothyroidism   . Nausea   . Gallstones   . Parkinson disease     Past Surgical History  Procedure Laterality Date  . Thyroidectomy    . Hysterecomy - unknown type    . Appendectomy  1950  . Eye surgery      both cataracts  . Cholecystectomy  07/01/2011    Procedure: LAPAROSCOPIC CHOLECYSTECTOMY WITH INTRAOPERATIVE CHOLANGIOGRAM;  Surgeon: Joyice Faster. Cornett, MD;  Location: [facility];  Service: General;  Laterality: N/A;    There were no vitals filed for this visit.  Visit Diagnosis:  Abnormality of gait      Subjective Assessment - 05/25/14 1155    Subjective Went to the beach with her daughter.  No falls or changes since last visit.  Drove here today.   Patient Stated Goals Pt wants to remain independent as long as possible   Currently in Pain? No/denies                         [facility] Adult PT Treatment/Exercise - 05/25/14 0001    Transfers   Transfers Sit to Stand;Stand to Sit   Sit to Stand 6: Modified independent (Device/Increase time);With upper extremity assist;Without upper extremity assist;From chair/3-in-1;Five times sit to stand   Stand to Sit 6: Modified independent (Device/Increase time);With upper extremity assist;Without  upper extremity assist;To chair/3-in-1   Ambulation/Gait   Ambulation/Gait Yes   Ambulation/Gait Assistance 5: Supervision   Ambulation/Gait Assistance Details timed walking for 5 minutes before needing rest break;cues for posture, intensity and armswing   Ambulation Distance (Feet) 500 Feet  plus;120 x 2   Assistive device None   Gait Pattern Decreased step length - right;Decreased step length - left;Trunk flexed;Narrow base of support  decreased arm swing   Ambulation Surface Level;Indoor           PWR [facility]) - 05/25/14 1243    PWR! exercises Moves in sitting;Moves in standing   PWR! Up 10   PWR! Rock 10   PWR! Twist 10   PWR Step 10   Comments at counter top with cues for intensity and technique;used visual targets for rock   PWR! Up 20   PWR! Rock 10   PWR! Twist 10   PWR! Step 10   Comments cues for technique and intensity             PT Education - 05/25/14 1249    Education provided Yes   Education Details Walking program, HEP   Person(s) Educated Patient   Methods Demonstration;Verbal cues;Handout   Comprehension Verbalized understanding          PT Short Term Goals - 05/04/14 2215  PT SHORT TERM GOAL #1   Title Pt will be be independent with HEP for improved balance, transfers, and gait.  Target 06/03/14   Time 4   Period Weeks   Status New   PT SHORT TERM GOAL #2   Title Pt will improve Timed Up and Go score to less than or equal to 18 seconds for decreased fall risk.   Time 4   Period Weeks   Status New   PT SHORT TERM GOAL #3   Title Pt will improve Functional Gait Assessment score to at least 17/30 for decreased fall risk.    Time 4   Period Weeks   Status New   PT SHORT TERM GOAL #4   Title Pt will perform 5x sit<>stand test in less than or equal to 15 seconds for improved efficiency and safety with transfers.   Time 4   Period Weeks   Status New   PT SHORT TERM GOAL #5   Title Pt will verbalize understanding of local Parkinson's  disease resources.   Time 4   Period Weeks   Status New           PT Long Term Goals - 05/04/14 2219    PT LONG TERM GOAL #1   Title Pt will verbalize understanding of fall prevention techniques within the home environment. Target 07/03/14   Time 8   Period Weeks   Status New   PT LONG TERM GOAL #2   Title Pt will improve TUG score to less than or equal to 13.5 seconds for decreased fall risk.   Time 8   Period Weeks   Status New   PT LONG TERM GOAL #3   Title Pt will improve Functional Gait Assessment to at least 22/30 for decreased fall risk.   Time 8   Period Weeks   Status New   PT LONG TERM GOAL #4   Title Pt will improve gait velocity to at least 2.62 ft/sec for improved gait efficiency and safety.   Time 8   Period Weeks   Status New   PT LONG TERM GOAL #5   Title Pt will verbalize understanding of continued community fitness upon D/C from PT.   Time 8   Period Weeks   Status New               Plan - 05/25/14 1250    Clinical Impression Statement Pt needs cues for intensity and posture.  Reports not doing exercises as frequently as directed at home due to travel.  Continue PT per POC.   Pt will benefit from skilled therapeutic intervention in order to improve on the following deficits Abnormal gait;Decreased balance;Decreased mobility;Difficulty walking;Decreased strength;Postural dysfunction   Rehab Potential Good   PT Frequency 2x / week   PT Duration 8 weeks   PT Treatment/Interventions ADLs/Self Care Home Management;Gait training;Functional mobility training;Therapeutic activities;Neuromuscular re-education;Balance training;Therapeutic exercise;Patient/family education   PT Next Visit Plan Follow up on walking program at home.  Door frame stretch/stance for posture.  Balance activities.   Consulted and Agree with Plan of Care Patient        Problem List Patient Active Problem List   Diagnosis Date Noted  . Parkinson's disease 03/24/2013  .  Chest discomfort 11/18/2011  . Dyspnea 11/18/2011    Narda Bonds 05/25/2014, 12:53 PM  [facility] [address] [place], [place], [address] Phone: [phone]   Fax:  [phone]  Narda Bonds, [place] 05/25/2014 12:53 PM Phone: [phone] Fax: [phone]

## 2014-05-27 ENCOUNTER — Ambulatory Visit: Payer: Medicare Other

## 2014-05-27 DIAGNOSIS — R293 Abnormal posture: Secondary | ICD-10-CM

## 2014-05-27 DIAGNOSIS — R269 Unspecified abnormalities of gait and mobility: Secondary | ICD-10-CM | POA: Diagnosis not present

## 2014-05-27 DIAGNOSIS — R258 Other abnormal involuntary movements: Secondary | ICD-10-CM | POA: Diagnosis not present

## 2014-05-27 NOTE — Therapy (Signed)
[facility] [address] [place], [place], [address] Phone: [phone]   Fax:  [phone]  Physical Therapy Treatment  Patient Details  Name: Courtney Reed MRN: [id number] Date of Birth: 1926/09/02 Referring Provider:  Alroy Dust, L.Marlou Sa, MD  Encounter Date: 05/27/2014      PT End of Session - 05/27/14 1222    Visit Number 4   Number of Visits 17   Date for PT Re-Evaluation 07/03/14   PT Start Time 1105   PT Stop Time 1145   PT Time Calculation (min) 40 min      Past Medical History  Diagnosis Date  . Thyroid disease   . Hypothyroidism   . Nausea   . Gallstones   . Parkinson disease     Past Surgical History  Procedure Laterality Date  . Thyroidectomy    . Hysterecomy - unknown type    . Appendectomy  1950  . Eye surgery      both cataracts  . Cholecystectomy  07/01/2011    Procedure: LAPAROSCOPIC CHOLECYSTECTOMY WITH INTRAOPERATIVE CHOLANGIOGRAM;  Surgeon: Joyice Faster. Cornett, MD;  Location: [facility];  Service: General;  Laterality: N/A;    There were no vitals filed for this visit.  Visit Diagnosis:  Posture abnormality      Subjective Assessment - 05/27/14 1107    Subjective (p) Pt reports her daughter has "been on her butt about her bad posture; pt reports she is so bossy"   Currently in Pain? (p) No/denies         Therex: -Discussed plans for walking program and progressing this at home -pt reported some of the exercises she likes to perform at home and therapist recommended she discontinue forward shoulder rolling, and replace with with backward shoulder rolling for improved posture. Pt was also performing shoulder shrugs and therapist recommended discontinuing this as it does not promote better posture.   -seated pelvic tilts with tactile cue 10x -seated backward shoulder rolling 20x -attempted standing cervical retraction with mirror and tactile cues and demo but  unable to coordinate this; then performed in sitting with extra time and MIN A to perform properly -scapular squeezes 30x with tactile cue -Rhomboid squeezes with green theraband resistance and tactile cue in standing 3x10 -Supine on towel roll with therapist overpressure-passive pectoralis stretch on each UE -seated thoracic spinal extension stretch over small ball and with MIN A for support -sit to stands 10x with overhead reach   Pt reported dizziness on two occasions with rapid movement. Tested gaze x1 but no impairment noted. May benefit from further occulomotor testing.                         PT Short Term Goals - 05/04/14 2215    PT SHORT TERM GOAL #1   Title Pt will be be independent with HEP for improved balance, transfers, and gait.  Target 06/03/14   Time 4   Period Weeks   Status New   PT SHORT TERM GOAL #2   Title Pt will improve Timed Up and Go score to less than or equal to 18 seconds for decreased fall risk.   Time 4   Period Weeks   Status New   PT SHORT TERM GOAL #3   Title Pt will improve Functional Gait Assessment score to at least 17/30 for decreased fall risk.    Time 4   Period Weeks   Status New  PT SHORT TERM GOAL #4   Title Pt will perform 5x sit<>stand test in less than or equal to 15 seconds for improved efficiency and safety with transfers.   Time 4   Period Weeks   Status New   PT SHORT TERM GOAL #5   Title Pt will verbalize understanding of local Parkinson's disease resources.   Time 4   Period Weeks   Status New           PT Long Term Goals - 05/04/14 2219    PT LONG TERM GOAL #1   Title Pt will verbalize understanding of fall prevention techniques within the home environment. Target 07/03/14   Time 8   Period Weeks   Status New   PT LONG TERM GOAL #2   Title Pt will improve TUG score to less than or equal to 13.5 seconds for decreased fall risk.   Time 8   Period Weeks   Status New   PT LONG TERM GOAL #3    Title Pt will improve Functional Gait Assessment to at least 22/30 for decreased fall risk.   Time 8   Period Weeks   Status New   PT LONG TERM GOAL #4   Title Pt will improve gait velocity to at least 2.62 ft/sec for improved gait efficiency and safety.   Time 8   Period Weeks   Status New   PT LONG TERM GOAL #5   Title Pt will verbalize understanding of continued community fitness upon D/C from PT.   Time 8   Period Weeks   Status New               Plan - 05/27/14 1222    Clinical Impression Statement Pt hasn't yet started her walking program, but was able to recall instructions for this and plans to start walking for exercise at home. She understands that she needs to walk brisk enough to get her heartrate up.  Today's session emphasized addressing parkinsons's related posture impairments as this was of great concern to pt today.   PT Next Visit Plan Follow up on walking program at home.  Assist pt with cervical and scapular retraction.  Balance activities.   Consulted and Agree with Plan of Care Patient        Problem List Patient Active Problem List   Diagnosis Date Noted  . Parkinson's disease 03/24/2013  . Chest discomfort 11/18/2011  . Dyspnea 11/18/2011    Delrae Sawyers, PT,DPT,NCS 05/27/2014 12:31 PM Phone [phone] FAX [phone]         [facility] [address] [place], [place], [address] Phone: [phone]   Fax:  [phone]

## 2014-05-31 ENCOUNTER — Ambulatory Visit: Payer: Medicare Other

## 2014-05-31 DIAGNOSIS — R293 Abnormal posture: Secondary | ICD-10-CM | POA: Diagnosis not present

## 2014-05-31 DIAGNOSIS — R258 Other abnormal involuntary movements: Secondary | ICD-10-CM

## 2014-05-31 DIAGNOSIS — R269 Unspecified abnormalities of gait and mobility: Secondary | ICD-10-CM

## 2014-05-31 NOTE — Therapy (Signed)
[facility] [address] [place], [place], [address] Phone: [phone]   Fax:  [phone]  Physical Therapy Treatment  Patient Details  Name: Courtney Reed MRN: [id number] Date of Birth: May 01, 1926 Referring Provider:  Alroy Dust, L.Marlou Sa, MD  Encounter Date: 05/31/2014      PT End of Session - 05/31/14 1159    Visit Number 5   Number of Visits 17   Date for PT Re-Evaluation 07/03/14   PT Start Time 3419   PT Stop Time 1147   PT Time Calculation (min) 42 min      Past Medical History  Diagnosis Date  . Thyroid disease   . Hypothyroidism   . Nausea   . Gallstones   . Parkinson disease     Past Surgical History  Procedure Laterality Date  . Thyroidectomy    . Hysterecomy - unknown type    . Appendectomy  1950  . Eye surgery      both cataracts  . Cholecystectomy  07/01/2011    Procedure: LAPAROSCOPIC CHOLECYSTECTOMY WITH INTRAOPERATIVE CHOLANGIOGRAM;  Surgeon: Joyice Faster. Cornett, MD;  Location: [facility];  Service: General;  Laterality: N/A;    There were no vitals filed for this visit.  Visit Diagnosis:  Posture abnormality  Abnormality of gait  Postural instability  Bradykinesia      Subjective Assessment - 05/31/14 1106    Subjective Did a lot of yard work yesterday and feeling sore today.    Currently in Pain? No/denies   Pain Relieving Factors pt had pain earlier this am but took 2 aspirin this am and feeling better       Gait training x5 minutes with emphasis on large step length and large amplitude reciprocal arm swing and increased speed. Pt was fatigued and required rest break after 5 minutes. Discussed performing this inside her home for HEP.  Therex: Cervical retraction attempted in sitting with difficulty performing correctly; then performed in supine with towel roll and verbal cues x15. Extra time for cueing to achieve correct form.  Standing scapular rows 3 x10  reps resisted by green Theraband with verbal, tactile cueing for technique, decreased compensation with upper trapezius.   Neuro Re-ed: - Standing at parallel bars, pt performed the following activities with CGA for stability/balance: bilat ankle plantarflexion/dorsiflexion on rocker board to address ankle strategy; progressed to squats on rocker board (oriented for A/P instability) with concurrent bilat shoulder flexion/extension (emphasis on large amplitude movement) with Min guard-Min A for stability. Pt required verbal/tactile cueing for technique, large amplitude movements, upright posture.  - Standing on mat (x2 layers), pt performed bilat alternating reciprocal shoulder/hip flexion with mirror for visual feedback. Pt required intermittent Min A for stability, cueing for technique, coordination of reciprocal movements.  - Sit <> stand onto mat (x2 layers) with concurrent bilat shoulder flexion upon standing 2 x10 reps with CGA for stability.  - In standing, pt engaged in zoom ball for increased amplitude of upper extremity movement and improved standing balance. Originally attempted on airex compliant surface for increased balance challenge but pt required downgrade to solid surface due to difficulty coordinating task.                              PT Short Term Goals - 05/04/14 2215    PT SHORT TERM GOAL #1   Title Pt will be be independent with HEP for improved balance, transfers, and  gait.  Target 06/03/14   Time 4   Period Weeks   Status New   PT SHORT TERM GOAL #2   Title Pt will improve Timed Up and Go score to less than or equal to 18 seconds for decreased fall risk.   Time 4   Period Weeks   Status New   PT SHORT TERM GOAL #3   Title Pt will improve Functional Gait Assessment score to at least 17/30 for decreased fall risk.    Time 4   Period Weeks   Status New   PT SHORT TERM GOAL #4   Title Pt will perform 5x sit<>stand test in less than or equal to  15 seconds for improved efficiency and safety with transfers.   Time 4   Period Weeks   Status New   PT SHORT TERM GOAL #5   Title Pt will verbalize understanding of local Parkinson's disease resources.   Time 4   Period Weeks   Status New           PT Long Term Goals - 05/04/14 2219    PT LONG TERM GOAL #1   Title Pt will verbalize understanding of fall prevention techniques within the home environment. Target 07/03/14   Time 8   Period Weeks   Status New   PT LONG TERM GOAL #2   Title Pt will improve TUG score to less than or equal to 13.5 seconds for decreased fall risk.   Time 8   Period Weeks   Status New   PT LONG TERM GOAL #3   Title Pt will improve Functional Gait Assessment to at least 22/30 for decreased fall risk.   Time 8   Period Weeks   Status New   PT LONG TERM GOAL #4   Title Pt will improve gait velocity to at least 2.62 ft/sec for improved gait efficiency and safety.   Time 8   Period Weeks   Status New   PT LONG TERM GOAL #5   Title Pt will verbalize understanding of continued community fitness upon D/C from PT.   Time 8   Period Weeks   Status New               Plan - 05/31/14 1207    Clinical Impression Statement Pt still hasn't started walking program due to weather but agreeable to initiating wihtin home. Today's session focused on dynamic standing balance and facilitating large amplitude movements. Pt will continue to benefit from skilled therapy to continue to progress toward goals.   PT Next Visit Plan Follow up on walking program at home. Quadruped PWR! activities modified at counter.        Problem List Patient Active Problem List   Diagnosis Date Noted  . Parkinson's disease 03/24/2013  . Chest discomfort 11/18/2011  . Dyspnea 11/18/2011    Delrae Sawyers, PT,DPT,NCS 05/31/2014 12:14 PM Phone [phone] FAX [phone]         [facility] [address] [place], [place], [address] Phone: [phone]   Fax:  [phone]

## 2014-06-03 ENCOUNTER — Ambulatory Visit: Payer: Medicare Other

## 2014-06-03 DIAGNOSIS — R293 Abnormal posture: Secondary | ICD-10-CM

## 2014-06-03 DIAGNOSIS — R269 Unspecified abnormalities of gait and mobility: Secondary | ICD-10-CM

## 2014-06-03 DIAGNOSIS — R258 Other abnormal involuntary movements: Secondary | ICD-10-CM | POA: Diagnosis not present

## 2014-06-03 NOTE — Therapy (Signed)
[facility] [address] [place], [place], [address] Phone: [phone]   Fax:  [phone]  Physical Therapy Treatment  Patient Details  Name: Courtney Reed MRN: [id number] Date of Birth: 09-Nov-1926 Referring Provider:  Alroy Dust, L.Marlou Sa, MD  Encounter Date: 06/03/2014      PT End of Session - 06/03/14 1115    Visit Number 6   Number of Visits 17   Date for PT Re-Evaluation 07/03/14   PT Start Time 1018   PT Stop Time 1100   PT Time Calculation (min) 42 min      Past Medical History  Diagnosis Date  . Thyroid disease   . Hypothyroidism   . Nausea   . Gallstones   . Parkinson disease     Past Surgical History  Procedure Laterality Date  . Thyroidectomy    . Hysterecomy - unknown type    . Appendectomy  1950  . Eye surgery      both cataracts  . Cholecystectomy  07/01/2011    Procedure: LAPAROSCOPIC CHOLECYSTECTOMY WITH INTRAOPERATIVE CHOLANGIOGRAM;  Surgeon: Joyice Faster. Cornett, MD;  Location: [facility];  Service: General;  Laterality: N/A;    There were no vitals filed for this visit.  Visit Diagnosis:  Abnormality of gait  Postural instability  Posture abnormality      Subjective Assessment - 06/03/14 1024    Subjective Pt reports she forgot to take her medicine at lunch yesterday but that is the only time she has missed a dose and she said she is not supposed to double up to make up for missed dose.  Pt denies complications from missing her edication but reports she was sluggish this a.   Currently in Pain? No/denies      Therex: Seated anterior/posterior pelvic tilts x30 Seated hamstring stretching 3x30 seconds with education to pt that this can decrease hamstring muscle spasms that wake her up at night. Provided as HEP  Theract: -PWR moves-see flowsheet -practiced rising and lowering from floor with BUE support on external object (therapy mat). Pt performed 1x with  supervision and verbal cues then 1x with MOD I. Then attempted to rise without external support with therapist demo and verbal cues and MIN/MOD A but unable and reporting hamstring cramp. Pt able to rise again with UE support and MOD I.                     [facility]) - 06/03/14 1027    PWR! exercises Moves in quadraped  on padded therapy mat   PWR! Up x10   PWR! Rock x10   PWR! Twist x10  on forearms to protect wrist   PWR! Step x10  modified at counter due to hamstring cramping             PT Education - 06/03/14 1114    Education provided Yes   Education Details hamstring stretches   Person(s) Educated Patient   Methods Explanation;Handout   Comprehension Verbalized understanding          PT Short Term Goals - 05/04/14 2215    PT SHORT TERM GOAL #1   Title Pt will be be independent with HEP for improved balance, transfers, and gait.  Target 06/03/14   Time 4   Period Weeks   Status New   PT SHORT TERM GOAL #2   Title Pt will improve Timed Up and Go score to less than or equal to 18 seconds for  decreased fall risk.   Time 4   Period Weeks   Status New   PT SHORT TERM GOAL #3   Title Pt will improve Functional Gait Assessment score to at least 17/30 for decreased fall risk.    Time 4   Period Weeks   Status New   PT SHORT TERM GOAL #4   Title Pt will perform 5x sit<>stand test in less than or equal to 15 seconds for improved efficiency and safety with transfers.   Time 4   Period Weeks   Status New   PT SHORT TERM GOAL #5   Title Pt will verbalize understanding of local Parkinson's disease resources.   Time 4   Period Weeks   Status New           PT Long Term Goals - 05/04/14 2219    PT LONG TERM GOAL #1   Title Pt will verbalize understanding of fall prevention techniques within the home environment. Target 07/03/14   Time 8   Period Weeks   Status New   PT LONG TERM GOAL #2   Title Pt will improve TUG score to less than or equal to  13.5 seconds for decreased fall risk.   Time 8   Period Weeks   Status New   PT LONG TERM GOAL #3   Title Pt will improve Functional Gait Assessment to at least 22/30 for decreased fall risk.   Time 8   Period Weeks   Status New   PT LONG TERM GOAL #4   Title Pt will improve gait velocity to at least 2.62 ft/sec for improved gait efficiency and safety.   Time 8   Period Weeks   Status New   PT LONG TERM GOAL #5   Title Pt will verbalize understanding of continued community fitness upon D/C from PT.   Time 8   Period Weeks   Status New               Plan - 06/03/14 1115    Clinical Impression Statement Pt has not started walking in her home for exercise yet. She reports she walked at [employer]. Pt able to tolerate quadruped position for PWR! moves, (except for PWR! step) and will benefit from further training before adding this to HEP.   PT Next Visit Plan Check STGs. Continue with PWR! moves quadruped exercises, continue to encourage participation in walking program at home.         Problem List Patient Active Problem List   Diagnosis Date Noted  . Parkinson's disease 03/24/2013  . Chest discomfort 11/18/2011  . Dyspnea 11/18/2011   Delrae Sawyers, PT,DPT,NCS 06/03/2014 11:21 AM Phone [phone] FAX [phone]         [facility] [address] [place], [place], [address] Phone: [phone]   Fax:  [phone]

## 2014-06-03 NOTE — Patient Instructions (Signed)
HIP: Hamstrings - Short Sitting   Scoot to edge of mat and put one leg out in front of you. Keep knee straight. Lift chest and lean forward leading with your chest. Hold 30 seconds. 3 reps per leg. Perform before you go to bed at night.   Copyright  VHI. All rights reserved.

## 2014-06-08 ENCOUNTER — Ambulatory Visit: Payer: Medicare Other | Attending: Diagnostic Neuroimaging | Admitting: Physical Therapy

## 2014-06-08 DIAGNOSIS — R258 Other abnormal involuntary movements: Secondary | ICD-10-CM | POA: Insufficient documentation

## 2014-06-08 DIAGNOSIS — R269 Unspecified abnormalities of gait and mobility: Secondary | ICD-10-CM | POA: Diagnosis not present

## 2014-06-08 DIAGNOSIS — R293 Abnormal posture: Secondary | ICD-10-CM | POA: Insufficient documentation

## 2014-06-08 NOTE — Therapy (Signed)
[facility] [address] [place], [place], [address] Phone: [phone]   Fax:  [phone]  Physical Therapy Treatment  Patient Details  Name: Courtney Reed MRN: [id number] Date of Birth: [birth year] Referring Provider:  Clovis Riley, L.August Saucer, MD  Encounter Date: 06/08/2014      PT End of Session - 06/08/14 1241    Visit Number 7   Number of Visits 17   Date for PT Re-Evaluation 07/03/14   PT Start Time 1150   PT Stop Time 1230   PT Time Calculation (min) 40 min   Activity Tolerance Patient tolerated treatment well   Behavior During Therapy [facility] for tasks assessed/performed      Past Medical History  Diagnosis Date  . Thyroid disease   . Hypothyroidism   . Nausea   . Gallstones   . Parkinson disease     Past Surgical History  Procedure Laterality Date  . Thyroidectomy    . Hysterecomy - unknown type    . Appendectomy  1950  . Eye surgery      both cataracts  . Cholecystectomy  07/01/2011    Procedure: LAPAROSCOPIC CHOLECYSTECTOMY WITH INTRAOPERATIVE CHOLANGIOGRAM;  Surgeon: Clovis Pu. Cornett, MD;  Location: [facility];  Service: General;  Laterality: N/A;    There were no vitals filed for this visit.  Visit Diagnosis:  Abnormality of gait  Postural instability      Subjective Assessment - 06/08/14 1152    Subjective She has not had any recent falls.  Felt dizzy (unsteady) this morning            [facility] PT Assessment - 06/08/14 1156    Transfers   Transfers Sit to Stand;Stand to Sit   Sit to Stand 6: Modified independent (Device/Increase time);Five times sit to stand  5x sit<>stand:  13.96 sec   Stand to Sit 6: Modified independent (Device/Increase time)   Timed Up and Go Test   TUG Normal TUG   Normal TUG (seconds) 13.46   Functional Gait  Assessment   Gait assessed  Yes   Gait Level Surface Walks 20 ft, slow speed, abnormal gait pattern, evidence for imbalance or deviates  10-15 in outside of the 12 in walkway width. Requires more than 7 sec to ambulate 20 ft.  8.46 sec   Change in Gait Speed Able to smoothly change walking speed without loss of balance or gait deviation. Deviate no more than 6 in outside of the 12 in walkway width.   Gait with Horizontal Head Turns Performs head turns smoothly with slight change in gait velocity (eg, minor disruption to smooth gait path), deviates 6-10 in outside 12 in walkway width, or uses an assistive device.   Gait with Vertical Head Turns Performs task with slight change in gait velocity (eg, minor disruption to smooth gait path), deviates 6 - 10 in outside 12 in walkway width or uses assistive device   Gait and Pivot Turn Pivot turns safely within 3 sec and stops quickly with no loss of balance.   Step Over Obstacle Is able to step over one shoe box (4.5 in total height) but must slow down and adjust steps to clear box safely. May require verbal cueing.   Gait with Narrow Base of Support Ambulates 7-9 steps.   Gait with Eyes Closed Cannot walk 20 ft without assistance, severe gait deviations or imbalance, deviates greater than 15 in outside 12 in walkway width or will not attempt task.  Ambulating Backwards Walks 20 ft, slow speed, abnormal gait pattern, evidence for imbalance, deviates 10-15 in outside 12 in walkway width.  21.72 sec in 20 ft   Steps Alternating feet, must use rail.   Total Score 17                     [facility] Adult PT Treatment/Exercise - 06/08/14 1156    Ambulation/Gait   Ambulation/Gait Yes   Ambulation/Gait Assistance 5: Supervision   Ambulation/Gait Assistance Details practiced walking turns for changing directions; pt initially pivots with turning, responds well to cues for turning with weightshifting   Assistive device None   Gait velocity 13.46 sec=2.44 ft/sec   Gait velocity - backwards 21.72 sec in 20 ft=0.92 ft/sec          Self Care:  Discussed progress towards goals and  therapist's recommendation to continue PT to further address dynamic gait and balance.  Provided community Parkinson's resources, as pt not sure she wants to continue PT beyond next visit.        PT Education - 06/08/14 1237    Education provided Yes   Education Details PWR! Moves weekly classes, Power over [employer] community group   Person(s) Educated Patient   Methods Explanation;Handout   Comprehension Verbalized understanding          PT Short Term Goals - 06/08/14 1238    PT SHORT TERM GOAL #1   Title Pt will be be independent with HEP for improved balance, transfers, and gait.  Target 06/03/14   Time 4   Period Weeks   Status On-going   PT SHORT TERM GOAL #2   Title Pt will improve Timed Up and Go score to less than or equal to 18 seconds for decreased fall risk.   Baseline 06/08/14-TUG 13.46 sec   Status Achieved   PT SHORT TERM GOAL #3   Title Pt will improve Functional Gait Assessment score to at least 17/30 for decreased fall risk.    Baseline FGA 06/08/14 is 17/30   Status Achieved   PT SHORT TERM GOAL #4   Title Pt will perform 5x sit<>stand test in less than or equal to 15 seconds for improved efficiency and safety with transfers.   Baseline 13.96 sec on 06/08/14   Status Achieved   PT SHORT TERM GOAL #5   Title Pt will verbalize understanding of local Parkinson's disease resources.   Status Achieved           PT Long Term Goals - 05/04/14 2219    PT LONG TERM GOAL #1   Title Pt will verbalize understanding of fall prevention techniques within the home environment. Target 07/03/14   Time 8   Period Weeks   Status New   PT LONG TERM GOAL #2   Title Pt will improve TUG score to less than or equal to 13.5 seconds for decreased fall risk.   Time 8   Period Weeks   Status New   PT LONG TERM GOAL #3   Title Pt will improve Functional Gait Assessment to at least 22/30 for decreased fall risk.   Time 8   Period Weeks   Status New   PT LONG TERM GOAL #4    Title Pt will improve gait velocity to at least 2.62 ft/sec for improved gait efficiency and safety.   Time 8   Period Weeks   Status New   PT LONG TERM GOAL #5   Title Pt will verbalize understanding  of continued community fitness upon D/C from PT.   Time 8   Period Weeks   Status New               Plan - 06/08/14 1241    Clinical Impression Statement Checked STGs today-STG #1 ongoing, did not check HEP; STG #2-5 are met.  Pt is making progress with therapy for gait, transfers, and balance.  Pt does remain at fall risk per Functional Gait Assessment score.  PT discussed that patient may benefit from further skilled PT; however, pt feels she may want to discharge next visit.  Provided options for PWR! Moves exercise class, as patient reports she has not been consistent with HEP at home.   Pt will benefit from skilled therapeutic intervention in order to improve on the following deficits Abnormal gait;Decreased balance;Decreased mobility;Difficulty walking;Decreased strength;Postural dysfunction   Rehab Potential Good   PT Frequency 2x / week   PT Duration 8 weeks   PT Treatment/Interventions ADLs/Self Care Home Management;Gait training;Functional mobility training;Therapeutic activities;Neuromuscular re-education;Balance training;Therapeutic exercise;Patient/family education   PT Next Visit Plan Discuss pt's decision regarding PWR! Moves classes, discharge vs continue PT; practice turning with walking; fall prevention information   Consulted and Agree with Plan of Care Patient        Problem List Patient Active Problem List   Diagnosis Date Noted  . Parkinson's disease 03/24/2013  . Chest discomfort 11/18/2011  . Dyspnea 11/18/2011    Kamil Hanigan W. 06/08/2014, 12:45 PM  Frazier Butt., PT [facility] [address] [place], [place], [address] Phone: [phone]   Fax:  [phone]

## 2014-06-09 ENCOUNTER — Ambulatory Visit: Payer: Medicare Other | Admitting: Physical Therapy

## 2014-06-09 DIAGNOSIS — R269 Unspecified abnormalities of gait and mobility: Secondary | ICD-10-CM

## 2014-06-09 DIAGNOSIS — R258 Other abnormal involuntary movements: Secondary | ICD-10-CM

## 2014-06-09 DIAGNOSIS — R293 Abnormal posture: Secondary | ICD-10-CM

## 2014-06-09 NOTE — Patient Instructions (Signed)

## 2014-06-09 NOTE — Therapy (Signed)
[facility] [address] [place], [place], [address] Phone: [phone]   Fax:  [phone]  Physical Therapy Treatment  Patient Details  Name: Courtney Reed MRN: [id number] Date of Birth: 07-29-1926 Referring Provider:  Alroy Dust, L.Marlou Sa, MD  Encounter Date: 06/09/2014      PT End of Session - 06/09/14 1153    Visit Number 8   Number of Visits 76  Pt chooses to discharge today's visit.   Date for PT Re-Evaluation 07/03/14   PT Start Time 1105   PT Stop Time 1148   PT Time Calculation (min) 43 min   Activity Tolerance Patient tolerated treatment well   Behavior During Therapy WFL for tasks assessed/performed      Past Medical History  Diagnosis Date  . Thyroid disease   . Hypothyroidism   . Nausea   . Gallstones   . Parkinson disease     Past Surgical History  Procedure Laterality Date  . Thyroidectomy    . Hysterecomy - unknown type    . Appendectomy  1950  . Eye surgery      both cataracts  . Cholecystectomy  07/01/2011    Procedure: LAPAROSCOPIC CHOLECYSTECTOMY WITH INTRAOPERATIVE CHOLANGIOGRAM;  Surgeon: Joyice Faster. Cornett, MD;  Location: [facility];  Service: General;  Laterality: N/A;    There were no vitals filed for this visit.  Visit Diagnosis:  Bradykinesia  Postural instability  Abnormality of gait      Subjective Assessment - 06/09/14 1107    Subjective Didn't realize loss of sense of smell had to do with Parkinson's disease.  Pt feels that she will discharge today.   Currently in Pain? No/denies        Gait:  Gait activities x 500 ft with no device, with initial cues for arm swing, posture, and step length.  Gait training with turning practice, including stepping/weightshifting turns.  Gait velocity:  12.68 seconds (2.59 ft/sec).        [facility]) - 06/09/14 1114    PWR! exercises Moves in sitting;Moves in standing  Review of HEP   PWR! Up 10   PWR! Rock 20    PWR! Twist 20   PWR Step 20   Comments with chair for support-cues for technique   PWR! Up 10   PWR! Rock 20   PWR! Twist 20   PWR! Step 20   Comments cues for technique and large amplitude      Self Care: Discussed importance of continuing to perform HEP on regular basis as well as benefits of joining PWR! Moves exercise class for weekly repetition/reinforcement of Parkinson's specific exercises.  Discussed plans to return for PT screen in 6 months.       PT Education - 06/09/14 1152    Education provided Yes   Education Details Prgoress towards goals, plans for discharge, fall prevention, continued community fitness and exercise options.   Person(s) Educated Patient   Methods Explanation;Handout   Comprehension Verbalized understanding          PT Short Term Goals - 06/09/14 1150    PT SHORT TERM GOAL #1   Title Pt will be be independent with HEP for improved balance, transfers, and gait.  Target 06/03/14   Baseline Pt reports not doing HEP on consistent basis-PT reiterates importance of consistent performance of HEP   Status Not Met           PT Long Term Goals - 06/09/14 1151  PT LONG TERM GOAL #1   Title Pt will verbalize understanding of fall prevention techniques within the home environment. Target 07/03/14   Status Achieved   PT LONG TERM GOAL #2   Title Pt will improve TUG score to less than or equal to 13.5 seconds for decreased fall risk.   Status Achieved   PT LONG TERM GOAL #3   Title Pt will improve Functional Gait Assessment to at least 22/30 for decreased fall risk.   Status Not Met   PT LONG TERM GOAL #4   Title Pt will improve gait velocity to at least 2.62 ft/sec for improved gait efficiency and safety.   Baseline 2.59 ft/sec 06-29-14   Status Not Met   PT LONG TERM GOAL #5   Title Pt will verbalize understanding of continued community fitness upon D/C from PT.   Status Achieved               Plan - 2014-06-29 1153    Clinical  Impression Statement Pt has not met STG #1-pt not performing HEP regularly at home and needs cues for proper technique of HEP.  LTG #1, 2, and 5 met.  LTG #3 and 4 not met.  Pt has decided that she wants to discharge today.  PT discussed importance of consistent performance of HEP and benefit of joining weekly PD exercise classes.   Pt will benefit from skilled therapeutic intervention in order to improve on the following deficits Abnormal gait;Decreased balance;Decreased mobility;Difficulty walking;Decreased strength;Postural dysfunction   Rehab Potential Good   PT Frequency 2x / week   PT Duration 8 weeks   PT Treatment/Interventions ADLs/Self Care Home Management;Gait training;Functional mobility training;Therapeutic activities;Neuromuscular re-education;Balance training;Therapeutic exercise;Patient/family education   PT Next Visit Plan Discharge today   Consulted and Agree with Plan of Care Patient          G-Codes - 06-29-14 1156    Functional Assessment Tool Used Functional Gait Assessment 17/30, gait velocity 2.58 ft/sec, TUG 13.46 sec   Functional Limitation Mobility: Walking and moving around   Mobility: Walking and Moving Around Goal Status [phone]) At least 1 percent but less than 20 percent impaired, limited or restricted   Mobility: Walking and Moving Around Discharge Status [phone]) At least 1 percent but less than 20 percent impaired, limited or restricted      Problem List Patient Active Problem List   Diagnosis Date Noted  . Parkinson's disease 03/24/2013  . Chest discomfort 11/18/2011  . Dyspnea 11/18/2011  PHYSICAL THERAPY DISCHARGE SUMMARY  Visits from Start of Care: 8  Current functional level related to goals / functional outcomes:     PT Long Term Goals - Jun 29, 2014 1151    PT LONG TERM GOAL #1   Title Pt will verbalize understanding of fall prevention techniques within the home environment. Target 07/03/14   Status Achieved   PT LONG TERM GOAL #2   Title Pt  will improve TUG score to less than or equal to 13.5 seconds for decreased fall risk.   Status Achieved   PT LONG TERM GOAL #3   Title Pt will improve Functional Gait Assessment to at least 22/30 for decreased fall risk.   Status Not Met   PT LONG TERM GOAL #4   Title Pt will improve gait velocity to at least 2.62 ft/sec for improved gait efficiency and safety.   Baseline 2.59 ft/sec 2014/06/29   Status Not Met   PT LONG TERM GOAL #5   Title Pt will verbalize  understanding of continued community fitness upon D/C from PT.   Status Achieved         PT Short Term Goals - 06/09/14 1150    PT SHORT TERM GOAL #1   Title Pt will be be independent with HEP for improved balance, transfers, and gait.  Target 06/03/14   Baseline Pt reports not doing HEP on consistent basis-PT reiterates importance of consistent performance of HEP   Status Not Met       Remaining deficits: Posture, balance, gait, bradykinesia   Education / Equipment: HEP, fall prevention, community fitness and Parkinson's related community resources.  Plan: Patient agrees to discharge.  Patient goals were partially met. Patient is being discharged due to being pleased with the current functional level.  ?????      MARRIOTT,AMY W. 06/09/2014, 11:58 AM [facility] [address] [place], [place], [address] Phone: [phone]   Fax:  [phone]

## 2014-06-10 ENCOUNTER — Ambulatory Visit: Payer: Medicare Other | Admitting: Physical Therapy

## 2014-06-21 DIAGNOSIS — H04123 Dry eye syndrome of bilateral lacrimal glands: Secondary | ICD-10-CM | POA: Diagnosis not present

## 2014-08-22 DIAGNOSIS — K5900 Constipation, unspecified: Secondary | ICD-10-CM | POA: Diagnosis not present

## 2014-08-22 DIAGNOSIS — M545 Low back pain: Secondary | ICD-10-CM | POA: Diagnosis not present

## 2014-08-24 ENCOUNTER — Other Ambulatory Visit: Payer: Self-pay | Admitting: Family Medicine

## 2014-08-24 ENCOUNTER — Ambulatory Visit
Admission: RE | Admit: 2014-08-24 | Discharge: 2014-08-24 | Disposition: A | Discharge status: Home / Self Care | Payer: Medicare Other | Source: Ambulatory Visit | Attending: Family Medicine | Admitting: Family Medicine

## 2014-08-24 DIAGNOSIS — M47817 Spondylosis without myelopathy or radiculopathy, lumbosacral region: Secondary | ICD-10-CM | POA: Diagnosis not present

## 2014-08-24 DIAGNOSIS — M4186 Other forms of scoliosis, lumbar region: Secondary | ICD-10-CM | POA: Diagnosis not present

## 2014-08-24 DIAGNOSIS — M545 Low back pain: Secondary | ICD-10-CM

## 2014-08-30 DIAGNOSIS — M545 Low back pain: Secondary | ICD-10-CM | POA: Diagnosis not present

## 2014-08-30 DIAGNOSIS — M4726 Other spondylosis with radiculopathy, lumbar region: Secondary | ICD-10-CM | POA: Diagnosis not present

## 2014-08-31 ENCOUNTER — Other Ambulatory Visit: Payer: Self-pay | Admitting: Orthopaedic Surgery

## 2014-08-31 DIAGNOSIS — M545 Low back pain: Secondary | ICD-10-CM

## 2014-08-31 DIAGNOSIS — Z780 Asymptomatic menopausal state: Secondary | ICD-10-CM

## 2014-09-07 ENCOUNTER — Ambulatory Visit
Admission: RE | Admit: 2014-09-07 | Discharge: 2014-09-07 | Disposition: A | Discharge status: Home / Self Care | Payer: Medicare Other | Source: Ambulatory Visit | Attending: Orthopaedic Surgery | Admitting: Orthopaedic Surgery

## 2014-09-07 DIAGNOSIS — Z780 Asymptomatic menopausal state: Secondary | ICD-10-CM | POA: Diagnosis not present

## 2014-09-13 ENCOUNTER — Ambulatory Visit
Admission: RE | Admit: 2014-09-13 | Discharge: 2014-09-13 | Disposition: A | Discharge status: Home / Self Care | Payer: Medicare Other | Source: Ambulatory Visit | Attending: Orthopaedic Surgery | Admitting: Orthopaedic Surgery

## 2014-09-13 DIAGNOSIS — S32020A Wedge compression fracture of second lumbar vertebra, initial encounter for closed fracture: Secondary | ICD-10-CM | POA: Diagnosis not present

## 2014-09-13 DIAGNOSIS — M545 Low back pain: Secondary | ICD-10-CM

## 2014-09-14 ENCOUNTER — Encounter: Payer: Medicare Other | Admitting: Physical Therapy

## 2014-09-14 DIAGNOSIS — R269 Unspecified abnormalities of gait and mobility: Secondary | ICD-10-CM

## 2014-09-21 DIAGNOSIS — M545 Low back pain: Secondary | ICD-10-CM | POA: Diagnosis not present

## 2014-09-21 DIAGNOSIS — M4726 Other spondylosis with radiculopathy, lumbar region: Secondary | ICD-10-CM | POA: Diagnosis not present

## 2014-10-25 DIAGNOSIS — M545 Low back pain: Secondary | ICD-10-CM | POA: Diagnosis not present

## 2014-10-25 DIAGNOSIS — M4726 Other spondylosis with radiculopathy, lumbar region: Secondary | ICD-10-CM | POA: Diagnosis not present

## 2014-12-28 DIAGNOSIS — Z23 Encounter for immunization: Secondary | ICD-10-CM | POA: Diagnosis not present

## 2015-01-05 ENCOUNTER — Ambulatory Visit: Payer: Medicare Other | Attending: Family Medicine | Admitting: Physical Therapy

## 2015-01-05 DIAGNOSIS — R269 Unspecified abnormalities of gait and mobility: Secondary | ICD-10-CM | POA: Insufficient documentation

## 2015-01-05 NOTE — Therapy (Signed)
[facility] [address] [place], [place], [address] Phone: [phone]   Fax:  [phone]  Patient Details  Name: Courtney Reed MRN: [id number] Date of Birth: [birth year] Referring Provider:  Alroy Dust, L.Marlou Sa, MD  Encounter Date: 01/05/2015   Physical Therapy Parkinson's Disease Screen   Timed Up and Go test:16.60 sec  10 meter walk test:  2.71 ft/sec  5 time sit to stand test:14.78 sec with slight posterior lean  Pt has not had any falls in recent months, but does report changes in balance.  She is in process of moving to [employer].  Discussed with patient slowing in her screen measures and potential benefit from physical therapy at this time.  However, she does not feel that she can commit to PT due to her upcoming move.  Advised patient to schedule additional PT screen in 6 months, but to notify physician if any additional balance or mobility changes occur, in order to be scheduled for PT evaluation.      MARRIOTT,AMY W. 01/05/2015, 10:48 AM  Frazier Butt., PT  [facility] [address] [place], [place], [address] Phone: [phone]   Fax:  [phone]

## 2015-01-05 NOTE — Therapy (Signed)
[facility] [address] [place], [place], [address] Phone: [phone]   Fax:  [phone]  Patient Details  Name: Courtney Reed MRN: [id number] Date of Birth: 01/12/1926 Referring Provider:  No ref. provider found  Encounter Date: 09/14/2014   No therapy provided this date-note created in error.  Frazier Butt., PT Frazier Butt. 01/05/2015, 8:10 AM  [facility] [address] [place], [place], [address] Phone: [phone]   Fax:  [phone]

## 2015-01-26 DIAGNOSIS — L570 Actinic keratosis: Secondary | ICD-10-CM | POA: Diagnosis not present

## 2015-01-26 DIAGNOSIS — C44529 Squamous cell carcinoma of skin of other part of trunk: Secondary | ICD-10-CM | POA: Diagnosis not present

## 2015-03-09 DIAGNOSIS — F419 Anxiety disorder, unspecified: Secondary | ICD-10-CM | POA: Diagnosis not present

## 2015-03-09 DIAGNOSIS — Z111 Encounter for screening for respiratory tuberculosis: Secondary | ICD-10-CM | POA: Diagnosis not present

## 2015-03-09 DIAGNOSIS — G20 Parkinson's disease: Secondary | ICD-10-CM | POA: Diagnosis not present

## 2015-03-09 DIAGNOSIS — M150 Primary generalized (osteo)arthritis: Secondary | ICD-10-CM | POA: Diagnosis not present

## 2015-03-09 DIAGNOSIS — N951 Menopausal and female climacteric states: Secondary | ICD-10-CM | POA: Diagnosis not present

## 2015-03-09 DIAGNOSIS — E039 Hypothyroidism, unspecified: Secondary | ICD-10-CM | POA: Diagnosis not present

## 2015-03-28 ENCOUNTER — Ambulatory Visit: Payer: Medicare Other | Admitting: Diagnostic Neuroimaging

## 2015-04-11 ENCOUNTER — Ambulatory Visit: Payer: Medicare Other | Admitting: Diagnostic Neuroimaging

## 2015-04-13 ENCOUNTER — Other Ambulatory Visit: Payer: Self-pay | Admitting: Diagnostic Neuroimaging

## 2015-04-24 DIAGNOSIS — N3000 Acute cystitis without hematuria: Secondary | ICD-10-CM | POA: Diagnosis not present

## 2015-04-24 DIAGNOSIS — R609 Edema, unspecified: Secondary | ICD-10-CM | POA: Diagnosis not present

## 2015-04-25 ENCOUNTER — Ambulatory Visit: Payer: Medicare Other | Admitting: Diagnostic Neuroimaging

## 2015-04-25 DIAGNOSIS — L570 Actinic keratosis: Secondary | ICD-10-CM | POA: Diagnosis not present

## 2015-04-25 DIAGNOSIS — Z85828 Personal history of other malignant neoplasm of skin: Secondary | ICD-10-CM | POA: Diagnosis not present

## 2015-04-25 DIAGNOSIS — L853 Xerosis cutis: Secondary | ICD-10-CM | POA: Diagnosis not present

## 2015-05-02 ENCOUNTER — Ambulatory Visit (INDEPENDENT_AMBULATORY_CARE_PROVIDER_SITE_OTHER): Payer: Medicare Other | Admitting: Diagnostic Neuroimaging

## 2015-05-02 ENCOUNTER — Encounter: Payer: Self-pay | Admitting: Diagnostic Neuroimaging

## 2015-05-02 VITALS — BP 136/73 | HR 72 | Ht 64.0 in | Wt 134.6 lb

## 2015-05-02 DIAGNOSIS — R269 Unspecified abnormalities of gait and mobility: Secondary | ICD-10-CM | POA: Diagnosis not present

## 2015-05-02 DIAGNOSIS — G20 Parkinson's disease: Secondary | ICD-10-CM | POA: Diagnosis not present

## 2015-05-02 MED ORDER — CARBIDOPA-LEVODOPA 25-100 MG PO TABS: 1.0000 | ORAL_TABLET | Freq: Three times a day (TID) | ORAL | Status: DC

## 2015-05-02 MED ORDER — ROPINIROLE HCL 2 MG PO TABS: 2.0000 mg | ORAL_TABLET | Freq: Three times a day (TID) | ORAL | Status: DC

## 2015-05-02 NOTE — Progress Notes (Signed)
[facility]  PATIENT: Courtney Reed DOB: 10-06-1926  REFERRING CLINICIAN:  HISTORY FROM: patient REASON FOR VISIT: follow up   HISTORICAL  CHIEF COMPLAINT:  Chief Complaint  Patient presents with  . Parkinson's disease    rm 6, "balance is my biggest problem."  . Follow-up    yearly    HISTORY OF PRESENT ILLNESS:   UPDATE 05/02/15: Since last visit, has moved to [employer] (1 month ago). Enjoying the transition. Has great attitude and doing well. Still some balance issues but no falls.   UPDATE 03/28/14: Since last visit, no falls. Tolerating meds. Tremor stable. Posture more stooped. Still living independently, driving, taking care of her ADLs.  UPDATE 03/24/13: Since last visit, has been taking ropinirole 1mg  TID + carb/levo 25/100 1 tab TID. No on off fluctuations. No wearing off. Taking meds at 7am, 2:30pm, 7:30pm. Go to sleep at [facility] with mild benefit.  UPDATE 09/21/12: Since last visit, taking ropinirole 2mg  TID and carb/levo (25/100) half tab TID. Tried full tab of carb/levo, but it makes her too sleepy. Fell down at home a few days ago. Now using 4 point cane.   UPDATE 03/04/12: Since last visit, taking ropinirole 2mg  TID consistently. Nausea has resolved. More tremor and balance difficulty. Has cut down on driving. She feels nervous with driving. She has had some panic attack episodes (chest pain, racing heart beat, sweating, shaking). Saw cardiology.  UPDATE 09/03/11: Feels stable on ropinirole 2mg  TID. Sometimes take 1mg  at noon, if she has AM nausea. Had cholecystectomy, and nausea has improved. No falls. Enjoys gardening.  UPDATE 03/19/11: Doing well. Some tremor in left hand now. Some balance diff.  UPDATE 11/16/10: Tolerating ropinirole 0.5mg  TID.  Tremor getting worse.  More freezing with gait.  Fell at home, but blames this on her shoe getting stuck outside.    UPDATE 08/15/10: Doing about the same; tried ropinirole 0.25 TID but  had to reduce due to nausea.  Still with tremor and balance diff.  PRIOR HPI (05/16/10): 82 year old right-handed female with history of hypothyroidism, here for evaluation of tremor since 2011. Patient reports gradual onset tremor, mainly in her right upper extremity, less in her left upper extremity, since 2011.  Tremor is most notable when she is relaxed, sitting and watching TV. The tremor improves and she is doing specific action such as using utensils or holding objects. Anxiety seems to worsen the tremor. She also reports balance difficulty. She fell down in the garden a few weeks ago.  She has had a decrease in her smell and taste abilities over several years. She has no sleep problems. Denies hallucinations, voice tremor, head tremor, swallowing difficulties. No family history of tremor.   REVIEW OF SYSTEMS: Full 14 system review of systems performed and negative except for back pain.    ALLERGIES: Allergies  Allergen Reactions  . Sulfacetamide Sodium   . Sulfa Antibiotics Rash    HOME MEDICATIONS: Outpatient Prescriptions Prior to Visit  Medication Sig Dispense Refill  . aspirin 81 MG tablet Take 81 mg by mouth daily.    . Calcium-Magnesium-Zinc (CALCIUM-MAGNESUIUM-ZINC PO) Take by mouth daily.    Marland Kitchen ibuprofen (ADVIL,MOTRIN) 200 MG tablet Take 200 mg by mouth every 6 (six) hours as needed.    Marland Kitchen levothyroxine (SYNTHROID, LEVOTHROID) 75 MCG tablet Take 75 mcg by mouth daily.    . Multiple Vitamins-Minerals (CENTRUM SILVER ULTRA WOMENS PO) Take by mouth.    . Multiple Vitamins-Minerals (HAIR/SKIN/NAILS  PO) Take by mouth.    . triamcinolone lotion (KENALOG) 0.1 %     . carbidopa-levodopa (SINEMET IR) 25-100 MG tablet TAKE 1 TABLET BY MOUTH THREE   (THREE) TIMES DAILY. 90 tablet 1  . rOPINIRole (REQUIP) 2 MG tablet TAKE 1 TABLET (2 MG TOTAL) BY MOUTH THREE   (THREE) TIMES DAILY. 90 tablet 1   No facility-administered medications prior to visit.    PAST MEDICAL HISTORY: Past Medical  History  Diagnosis Date  . Thyroid disease   . Hypothyroidism   . Nausea   . Gallstones   . Parkinson disease ([facility])     PAST SURGICAL HISTORY: Past Surgical History  Procedure Laterality Date  . Thyroidectomy    . Hysterecomy - unknown type    . Appendectomy  1950  . Eye surgery      both cataracts  . Cholecystectomy  07/01/2011    Procedure: LAPAROSCOPIC CHOLECYSTECTOMY WITH INTRAOPERATIVE CHOLANGIOGRAM;  Surgeon: Joyice Faster. Cornett, MD;  Location: [facility];  Service: General;  Laterality: N/A;    FAMILY HISTORY: Family History  Problem Relation Age of Onset  . Heart attack    . Cancer    . Coronary artery disease    . Heart disease Father     Heart attack    SOCIAL HISTORY:  Social History   Social History  . Marital Status: Widowed    Spouse Name: N/A  . Number of Children: 3  . Years of Education: N/A   Occupational History  . retired    Social History Main Topics  . Smoking status: Never Smoker   . Smokeless tobacco: Never Used  . Alcohol Use: No  . Drug Use: No  . Sexual Activity: Not on file   Other Topics Concern  . Not on file   Social History Narrative     PHYSICAL EXAM  Filed Vitals:   05/02/15 1326  BP: 136/73  Pulse: 72  Height: 5\' 4"  (1.626 m)  Weight: 134 lb 9.6 oz (61.054 kg)    Not recorded      Body mass index is 23.09 kg/(m^2).   GENERAL EXAM: Patient is in no distress  CARDIOVASCULAR: Regular rate and rhythm, no murmurs, no carotid bruits  NEUROLOGIC: MENTAL STATUS: awake, alert, language fluent, comprehension intact, naming intact CRANIAL NERVE:  pupils equal and reactive to light, visual fields full to confrontation, extraocular muscles intact, no nystagmus, facial sensation and strength symmetric, uvula midline, shoulder shrug symmetric, tongue midline. POSITIVE MYERSON'S. MOTOR: Normal bulk and tone.  RARE RESTING TREMOR IN BUE (RUE > LUE). NO RIGIDITY. MILD MOUTH TREMOR. BRADYKINESIA IN LUE AND  LLE. Full strength in the BUE, BLE SENSORY: normal and symmetric to light touch COORDINATION: finger-nose-finger, fine finger movements, heel-shin normal REFLEXES: deep tendon reflexes TRACE; ABSENT AT ANKLES.  GAIT/STATION: BUE TREMOR WHILE WALKING. SHORT STEPS. SLIGHT HESITATION WITH TURNS. STOOPED POSTURE.    DIAGNOSTIC DATA (LABS, IMAGING, TESTING) - I reviewed patient records, labs, notes, testing and imaging myself where available.  Lab Results  Component Value Date   WBC 8.0 06/28/2011   HGB 13.7 06/28/2011   HCT 40.1 06/28/2011   MCV 87.6 06/28/2011   PLT 227 06/28/2011      Component Value Date/Time   NA 133* 06/28/2011 1330   K 4.2 06/28/2011 1330   CL 96 06/28/2011 1330   CO2 28 06/28/2011 1330   GLUCOSE 110* 06/28/2011 1330   BUN 9 06/28/2011 1330   CREATININE 0.62 06/28/2011 1330  CALCIUM 9.4 06/28/2011 1330   PROT 7.0 06/28/2011 1330   ALBUMIN 3.8 06/28/2011 1330   AST 22 06/28/2011 1330   ALT 17 06/28/2011 1330   ALKPHOS 64 06/28/2011 1330   BILITOT 0.4 06/28/2011 1330   GFRNONAA 81* 06/28/2011 1330   GFRAA >90 06/28/2011 1330   No results found for: CHOL Lab Results  Component Value Date   HGBA1C  03/10/2008    5.9 (NOTE)   The ADA recommends the following therapeutic goal for glycemic   control related to Hgb A1C measurement:   Goal of Therapy:   < 7.0% Hgb A1C   Reference: American Diabetes Association: Clinical Practice   Recommendations 2008, Diabetes Care,  2008, 31:(Suppl 1).   No results found for: VITAMINB12 Lab Results  Component Value Date   TSH 2.131 *Test methodology is 3rd generation TSH* 03/10/2008    06/08/10 MRI brain - chronic stable changes of microvascular ischemia and mild generalized atrophy. No significant change compared with MRI 03/09/08.   ASSESSMENT AND PLAN  82 y.o. right-handed female with hypothyroidism, here with resting tremor, bradykinesia and postural instability.  Symptoms and exam are consistent with Parkinson's  disease. Overall stable.   Dx:  Parkinson's disease ([facility])  Gait difficulty   PLAN: - continue ropinirole 1mg  TID + carb/levo 25/100 1 tab TID  Meds ordered this encounter  Medications  . rOPINIRole (REQUIP) 2 MG tablet    Sig: Take 1 tablet (2 mg total) by mouth 3 (three) times daily.    Dispense:  270 tablet    Refill:  4  . carbidopa-levodopa (SINEMET IR) 25-100 MG tablet    Sig: Take 1 tablet by mouth 3 (three) times daily.    Dispense:  270 tablet    Refill:  4   Return in about 1 year (around 05/01/2016).    Penni Bombard, MD [id number], [id number] PM Certified in Neurology, Neurophysiology and Neuroimaging  [facility] [address] [place], [place] [address] [phone]

## 2015-05-02 NOTE — Patient Instructions (Signed)

## 2015-06-14 DIAGNOSIS — B0222 Postherpetic trigeminal neuralgia: Secondary | ICD-10-CM | POA: Diagnosis not present

## 2015-06-14 DIAGNOSIS — B0239 Other herpes zoster eye disease: Secondary | ICD-10-CM | POA: Diagnosis not present

## 2015-06-15 ENCOUNTER — Other Ambulatory Visit: Payer: Self-pay | Admitting: Diagnostic Neuroimaging

## 2015-06-16 DIAGNOSIS — B029 Zoster without complications: Secondary | ICD-10-CM | POA: Diagnosis not present

## 2015-06-19 DIAGNOSIS — B0239 Other herpes zoster eye disease: Secondary | ICD-10-CM | POA: Diagnosis not present

## 2015-06-21 DIAGNOSIS — B029 Zoster without complications: Secondary | ICD-10-CM

## 2015-06-21 HISTORY — DX: Zoster without complications: B02.9

## 2015-06-22 DIAGNOSIS — H04123 Dry eye syndrome of bilateral lacrimal glands: Secondary | ICD-10-CM | POA: Diagnosis not present

## 2015-06-22 DIAGNOSIS — B0239 Other herpes zoster eye disease: Secondary | ICD-10-CM | POA: Diagnosis not present

## 2015-06-30 DIAGNOSIS — H04123 Dry eye syndrome of bilateral lacrimal glands: Secondary | ICD-10-CM | POA: Diagnosis not present

## 2015-06-30 DIAGNOSIS — B0239 Other herpes zoster eye disease: Secondary | ICD-10-CM | POA: Diagnosis not present

## 2015-07-05 DIAGNOSIS — B0229 Other postherpetic nervous system involvement: Secondary | ICD-10-CM | POA: Diagnosis not present

## 2015-07-06 ENCOUNTER — Ambulatory Visit: Payer: Medicare Other | Admitting: Physical Therapy

## 2015-07-12 ENCOUNTER — Telehealth: Payer: Self-pay | Admitting: Diagnostic Neuroimaging

## 2015-07-12 DIAGNOSIS — B029 Zoster without complications: Secondary | ICD-10-CM | POA: Diagnosis not present

## 2015-07-12 DIAGNOSIS — B0229 Other postherpetic nervous system involvement: Secondary | ICD-10-CM | POA: Diagnosis not present

## 2015-07-12 NOTE — Telephone Encounter (Signed)
Spoke with Nira Conn, RN and gave her Dr Gladstone Lighter verbal order to increase gabapentin to 200 mg three times a day. Gave her patient's appointment date/time, advised she needs to arrive 15 min early. Heather verbalized understanding, appreciation.

## 2015-07-12 NOTE — Telephone Encounter (Addendum)
Spoke with Larena Glassman, Medical  Assistant to [place], NP, [facility]. She stated patient taking Gabapentin 100 mg tid with no relief of post herpetic nerve pain. Her PCP was contacted for ongoing pain, and patient prescribed tramadol, but w/no relief of pain and caused her dizziness. Informed her would discuss with Dr Leta Baptist and call her back. She verbalized understanding, appreciation.  Per Dr Rodney Cruise NP may increase Gabapentin to 200 mg three times a day; will schedule patient to see him 07/14/15.

## 2015-07-12 NOTE — Telephone Encounter (Signed)
Holly-NP with [facility] called sts she has been treating pt for shingles on trigeminal nerve, she has finished medication and has been on gabapentin for 7 days. Pt is in severe pain. Earnest Bailey feels she needs help with this pt. Can Dr Mamie Nick see her? Please call

## 2015-07-14 ENCOUNTER — Encounter: Payer: Self-pay | Admitting: Diagnostic Neuroimaging

## 2015-07-14 ENCOUNTER — Ambulatory Visit (INDEPENDENT_AMBULATORY_CARE_PROVIDER_SITE_OTHER): Payer: Medicare Other | Admitting: Diagnostic Neuroimaging

## 2015-07-14 VITALS — BP 150/69 | HR 80 | Ht 64.0 in | Wt 135.4 lb

## 2015-07-14 DIAGNOSIS — B0230 Zoster ocular disease, unspecified: Secondary | ICD-10-CM | POA: Diagnosis not present

## 2015-07-14 DIAGNOSIS — G20 Parkinson's disease: Secondary | ICD-10-CM | POA: Diagnosis not present

## 2015-07-14 MED ORDER — ROPINIROLE HCL 2 MG PO TABS: 2.0000 mg | ORAL_TABLET | Freq: Three times a day (TID) | ORAL | Status: DC

## 2015-07-14 MED ORDER — CARBIDOPA-LEVODOPA 25-100 MG PO TABS: 1.0000 | ORAL_TABLET | Freq: Three times a day (TID) | ORAL | Status: DC

## 2015-07-14 NOTE — Progress Notes (Signed)
[facility]  PATIENT: Courtney Reed DOB: 29-Dec-1926  REFERRING CLINICIAN:  HISTORY FROM: patient REASON FOR VISIT: follow up   HISTORICAL  CHIEF COMPLAINT:  Chief Complaint  Patient presents with  . Post herpetic pain    rm 6, Lives at [facility] alone, "shingles 06/21/15, saw NP 07/12/15 due to R eye /face pain, Gabapentin 200 mg TID-unsure if it is helping"    HISTORY OF PRESENT ILLNESS:   UPDATE 07/14/15: Since last visit, unfortunately, had new right V1 trigeminal shingles attack on 06/21/15, in spite of shingles vaccine in 2014. Has been to ophthalmology, using eye drops. Now on gabapentin 200mg  TID. Patient not sure if she received anti-viral therapy at onset. Parkinson's dz is stable.  UPDATE 05/02/15: Since last visit, has moved to [place] (1 month ago). Enjoying the transition. Has great attitude and doing well. Still some balance issues but no falls.   UPDATE 03/28/14: Since last visit, no falls. Tolerating meds. Tremor stable. Posture more stooped. Still living independently, driving, taking care of her ADLs.  UPDATE 03/24/13: Since last visit, has been taking ropinirole 1mg  TID + carb/levo 25/100 1 tab TID. No on off fluctuations. No wearing off. Taking meds at 7am, 2:30pm, 7:30pm. Go to sleep at [place] [facility] with mild benefit.  UPDATE 09/21/12: Since last visit, taking ropinirole 2mg  TID and carb/levo (25/100) half tab TID. Tried full tab of carb/levo, but it makes her too sleepy. Fell down at home a few days ago. Now using 4 point cane.   UPDATE 03/04/12: Since last visit, taking ropinirole 2mg  TID consistently. Nausea has resolved. More tremor and balance difficulty. Has cut down on driving. She feels nervous with driving. She has had some panic attack episodes (chest pain, racing heart beat, sweating, shaking). Saw cardiology.  UPDATE 09/03/11: Feels stable on ropinirole 2mg  TID. Sometimes take 1mg  at noon, if she has AM nausea. Had  cholecystectomy, and nausea has improved. No falls. Enjoys gardening.  UPDATE 03/19/11: Doing well. Some tremor in left hand now. Some balance diff.  UPDATE 11/16/10: Tolerating ropinirole 0.5mg  TID.  Tremor getting worse.  More freezing with gait.  Fell at home, but blames this on her shoe getting stuck outside.    UPDATE 08/15/10: Doing about the same; tried ropinirole 0.25 TID but had to reduce due to nausea.  Still with tremor and balance diff.  PRIOR HPI (05/16/10): 82 year old right-handed female with history of hypothyroidism, here for evaluation of tremor since 2011. Patient reports gradual onset tremor, mainly in her right upper extremity, less in her left upper extremity, since 2011.  Tremor is most notable when she is relaxed, sitting and watching TV. The tremor improves and she is doing specific action such as using utensils or holding objects. Anxiety seems to worsen the tremor. She also reports balance difficulty. She fell down in the garden a few weeks ago.  She has had a decrease in her smell and taste abilities over several years. She has no sleep problems. Denies hallucinations, voice tremor, head tremor, swallowing difficulties. No family history of tremor.   REVIEW OF SYSTEMS: Full 14 system review of systems performed and negative except for back pain.    ALLERGIES: Allergies  Allergen Reactions  . Sulfacetamide Sodium   . Sulfa Antibiotics Rash    HOME MEDICATIONS: Outpatient Prescriptions Prior to Visit  Medication Sig Dispense Refill  . carbidopa-levodopa (SINEMET IR) 25-100 MG tablet Take 1 tablet by mouth 3 (three) times daily. [place]  tablet 4  . estrogens, conjugated, (PREMARIN) 0.625 MG tablet Take 0.625 mg by mouth.    . levothyroxine (SYNTHROID, LEVOTHROID) 75 MCG tablet Take 75 mcg by mouth daily.    Marland Kitchen rOPINIRole (REQUIP) 2 MG tablet Take 1 tablet (2 mg total) by mouth 3 (three) times daily. 270 tablet 4  . aspirin 81 MG tablet Take 81 mg by mouth daily. Reported on  07/14/2015    . Calcium-Magnesium-Zinc (CALCIUM-MAGNESUIUM-ZINC PO) Take by mouth daily. Reported on 07/14/2015    . ibuprofen (ADVIL,MOTRIN) 200 MG tablet Take 200 mg by mouth every 6 (six) hours as needed. Reported on 07/14/2015    . Multiple Vitamins-Minerals (CENTRUM SILVER ULTRA WOMENS PO) Take by mouth. Reported on 07/14/2015    . Multiple Vitamins-Minerals (HAIR/SKIN/NAILS PO) Take by mouth. Reported on 07/14/2015    . triamcinolone lotion (KENALOG) 0.1 % Reported on 07/14/2015    . ALPRAZolam (XANAX) 0.25 MG tablet Take 0.25 mg by mouth 3 (three) times daily as needed for anxiety.     No facility-administered medications prior to visit.    PAST MEDICAL HISTORY: Past Medical History  Diagnosis Date  . Thyroid disease   . Hypothyroidism   . Nausea   . Gallstones   . Parkinson disease ([place])   . Shingles 06/21/15    PAST SURGICAL HISTORY: Past Surgical History  Procedure Laterality Date  . Thyroidectomy    . Hysterecomy - unknown type    . Appendectomy  1950  . Eye surgery      both cataracts  . Cholecystectomy  07/01/2011    Procedure: LAPAROSCOPIC CHOLECYSTECTOMY WITH INTRAOPERATIVE CHOLANGIOGRAM;  Surgeon: Joyice Faster. Cornett, MD;  Location: [place];  Service: General;  Laterality: N/A;    FAMILY HISTORY: Family History  Problem Relation Age of Onset  . Heart attack    . Cancer    . Coronary artery disease    . Heart disease Father     Heart attack    SOCIAL HISTORY:  Social History   Social History  . Marital Status: Widowed    Spouse Name: N/A  . Number of Children: 3  . Years of Education: N/A   Occupational History  . retired    Social History Main Topics  . Smoking status: Never Smoker   . Smokeless tobacco: Never Used  . Alcohol Use: No  . Drug Use: No  . Sexual Activity: Not on file   Other Topics Concern  . Not on file   Social History Narrative    PHYSICAL EXAM  Filed Vitals:   07/14/15 0857  BP: 150/69  Pulse: 80  Height:  5\' 4"  (1.626 m)  Weight: 135 lb 6.4 oz (61.417 kg)    Not recorded     Body mass index is 23.23 kg/(m^2).   GENERAL EXAM: Patient in MILD DISTRESS DUE TO RIGHT V1 PAIN; RIGHT EYE TEARING  CARDIOVASCULAR: Regular rate and rhythm, no murmurs, no carotid bruits  NEUROLOGIC: MENTAL STATUS: awake, alert, language fluent, comprehension intact, naming intact CRANIAL NERVE:  pupils equal and reactive to light, visual fields full to confrontation, extraocular muscles intact, no nystagmus, facial sensation --> HYPERSENSITIVE TO PAIN IN RIGHT V1; facial strength symmetric, uvula midline, shoulder shrug symmetric, tongue midline. POSITIVE MYERSON'S. MOTOR: Normal bulk and tone.  RARE RESTING TREMOR IN BUE (RUE > LUE). NO RIGIDITY. MILD MOUTH TREMOR. BRADYKINESIA IN LUE AND LLE. Full strength in the BUE, BLE SENSORY: normal and symmetric to light touch COORDINATION: finger-nose-finger, fine finger  movements, heel-shin normal REFLEXES: deep tendon reflexes TRACE; ABSENT AT ANKLES.  GAIT/STATION: BUE TREMOR WHILE WALKING. SHORT STEPS. SLIGHT HESITATION WITH TURNS. STOOPED POSTURE.    DIAGNOSTIC DATA (LABS, IMAGING, TESTING) - I reviewed patient records, labs, notes, testing and imaging myself where available.  Lab Results  Component Value Date   WBC 8.0 06/28/2011   HGB 13.7 06/28/2011   HCT 40.1 06/28/2011   MCV 87.6 06/28/2011   PLT 227 06/28/2011      Component Value Date/Time   NA 133* 06/28/2011 1330   K 4.2 06/28/2011 1330   CL 96 06/28/2011 1330   CO2 28 06/28/2011 1330   GLUCOSE 110* 06/28/2011 1330   BUN 9 06/28/2011 1330   CREATININE 0.62 06/28/2011 1330   CALCIUM 9.4 06/28/2011 1330   PROT 7.0 06/28/2011 1330   ALBUMIN 3.8 06/28/2011 1330   AST 22 06/28/2011 1330   ALT 17 06/28/2011 1330   ALKPHOS 64 06/28/2011 1330   BILITOT 0.4 06/28/2011 1330   GFRNONAA 81* 06/28/2011 1330   GFRAA >90 06/28/2011 1330   No results found for: CHOL Lab Results  Component Value  Date   HGBA1C  03/10/2008    5.9 (NOTE)   The ADA recommends the following therapeutic goal for glycemic   control related to Hgb A1C measurement:   Goal of Therapy:   < 7.0% Hgb A1C   Reference: American Diabetes Association: Clinical Practice   Recommendations 2008, Diabetes Care,  2008, 31:(Suppl 1).   No results found for: VITAMINB12 Lab Results  Component Value Date   TSH 2.131 *Test methodology is 3rd generation TSH* 03/10/2008    06/08/10 MRI brain - chronic stable changes of microvascular ischemia and mild generalized atrophy. No significant change compared with MRI 03/09/08.   ASSESSMENT AND PLAN  82 y.o. right-handed female with hypothyroidism, here with resting tremor, bradykinesia and postural instability.  Symptoms and exam are consistent with Parkinson's disease --> stable.  Now with new right V1 shingles attack on 06/21/15 (herpes zoster ophthalmicus).    Dx:  Herpes zoster ophthalmicus  Parkinson's disease ([place])   PLAN: - continue gabapentin 200mg  TID x 1 week, then increase to 300mg  TID - no role for anti-viral therapy at this time (> 72 hours since onset; no evidence of ongoing viral replication; no ongoing lesion formation) - continue ropinirole 2mg  TID + carb/levo 25/100 1 tab TID  Meds ordered this encounter  Medications  . carbidopa-levodopa (SINEMET IR) 25-100 MG tablet    Sig: Take 1 tablet by mouth 3 (three) times daily.    Dispense:  270 tablet    Refill:  4  . rOPINIRole (REQUIP) 2 MG tablet    Sig: Take 1 tablet (2 mg total) by mouth 3 (three) times daily.    Dispense:  270 tablet    Refill:  4   Return in about 2 months (around 09/14/2015).    Penni Bombard, MD [id number], [id number] AM Certified in Neurology, Neurophysiology and Neuroimaging  [facility] [address], [place], [place] [address] [phone]

## 2015-07-14 NOTE — Patient Instructions (Addendum)
-   continue gabapentin 200mg  three times per day; after 1 more week may increase to 300mg  three times per day.  - continue carbidopa/levodopa and ropinirole.

## 2015-07-21 DIAGNOSIS — B0239 Other herpes zoster eye disease: Secondary | ICD-10-CM | POA: Diagnosis not present

## 2015-07-21 DIAGNOSIS — H04123 Dry eye syndrome of bilateral lacrimal glands: Secondary | ICD-10-CM | POA: Diagnosis not present

## 2015-07-26 DIAGNOSIS — H10532 Contact blepharoconjunctivitis, left eye: Secondary | ICD-10-CM | POA: Diagnosis not present

## 2015-07-26 DIAGNOSIS — H04123 Dry eye syndrome of bilateral lacrimal glands: Secondary | ICD-10-CM | POA: Diagnosis not present

## 2015-07-26 DIAGNOSIS — H16102 Unspecified superficial keratitis, left eye: Secondary | ICD-10-CM | POA: Diagnosis not present

## 2015-07-26 DIAGNOSIS — R21 Rash and other nonspecific skin eruption: Secondary | ICD-10-CM | POA: Diagnosis not present

## 2015-07-26 DIAGNOSIS — H04202 Unspecified epiphora, left lacrimal gland: Secondary | ICD-10-CM | POA: Diagnosis not present

## 2015-08-03 DIAGNOSIS — H04123 Dry eye syndrome of bilateral lacrimal glands: Secondary | ICD-10-CM | POA: Diagnosis not present

## 2015-08-03 DIAGNOSIS — H02005 Unspecified entropion of left lower eyelid: Secondary | ICD-10-CM | POA: Diagnosis not present

## 2015-08-03 DIAGNOSIS — B0239 Other herpes zoster eye disease: Secondary | ICD-10-CM | POA: Diagnosis not present

## 2015-08-03 DIAGNOSIS — H04202 Unspecified epiphora, left lacrimal gland: Secondary | ICD-10-CM | POA: Diagnosis not present

## 2015-08-03 DIAGNOSIS — H16102 Unspecified superficial keratitis, left eye: Secondary | ICD-10-CM | POA: Diagnosis not present

## 2015-08-03 DIAGNOSIS — H10532 Contact blepharoconjunctivitis, left eye: Secondary | ICD-10-CM | POA: Diagnosis not present

## 2015-08-03 DIAGNOSIS — H02004 Unspecified entropion of left upper eyelid: Secondary | ICD-10-CM | POA: Diagnosis not present

## 2015-08-25 DIAGNOSIS — H04202 Unspecified epiphora, left lacrimal gland: Secondary | ICD-10-CM | POA: Diagnosis not present

## 2015-08-25 DIAGNOSIS — H16102 Unspecified superficial keratitis, left eye: Secondary | ICD-10-CM | POA: Diagnosis not present

## 2015-08-25 DIAGNOSIS — H04123 Dry eye syndrome of bilateral lacrimal glands: Secondary | ICD-10-CM | POA: Diagnosis not present

## 2015-08-25 DIAGNOSIS — H10532 Contact blepharoconjunctivitis, left eye: Secondary | ICD-10-CM | POA: Diagnosis not present

## 2015-08-25 DIAGNOSIS — B0239 Other herpes zoster eye disease: Secondary | ICD-10-CM | POA: Diagnosis not present

## 2015-08-25 DIAGNOSIS — H02005 Unspecified entropion of left lower eyelid: Secondary | ICD-10-CM | POA: Diagnosis not present

## 2015-08-25 DIAGNOSIS — H02004 Unspecified entropion of left upper eyelid: Secondary | ICD-10-CM | POA: Diagnosis not present

## 2015-09-18 ENCOUNTER — Ambulatory Visit (INDEPENDENT_AMBULATORY_CARE_PROVIDER_SITE_OTHER): Payer: Medicare Other | Admitting: Diagnostic Neuroimaging

## 2015-09-18 ENCOUNTER — Encounter: Payer: Self-pay | Admitting: Diagnostic Neuroimaging

## 2015-09-18 VITALS — BP 117/61 | HR 72 | Wt 134.0 lb

## 2015-09-18 DIAGNOSIS — B0230 Zoster ocular disease, unspecified: Secondary | ICD-10-CM | POA: Diagnosis not present

## 2015-09-18 DIAGNOSIS — G20 Parkinson's disease: Secondary | ICD-10-CM

## 2015-09-18 DIAGNOSIS — R269 Unspecified abnormalities of gait and mobility: Secondary | ICD-10-CM

## 2015-09-18 NOTE — Patient Instructions (Signed)
Thank you for coming to see [place] at [facility]. I hope we have been able to provide you high quality care today.  You may receive a patient satisfaction survey over the next few weeks. We would appreciate your feedback and comments so that we may continue to improve ourselves and the health of our patients.  - continue ropinirole - continue carbidopa/levodopa - ok to try hair coloring   ~~~~~~~~~~~~~~~~~~~~~~~~~~~~~~~~~~~~~~~~~~~~~~~~~~~~~~~~~~~~~~~~~  DR. PENUMALLI'S GUIDE TO HAPPY AND HEALTHY LIVING These are some of my general health and wellness recommendations. Some of them may apply to you better than others. Please use common sense as you try these suggestions and feel free to ask me any questions.   ACTIVITY/FITNESS Mental, social, emotional and physical stimulation are very important for brain and body health. Try learning a new activity (arts, music, language, sports, games).  Keep moving your body to the best of your abilities. You can do this at home, inside or outside, the park, community center, gym or anywhere you like. Consider a physical therapist or personal trainer to get started. Consider the app Sworkit. Fitness trackers such as smart-watches, smart-phones or Fitbits can help as well.   NUTRITION Eat more plants: colorful vegetables, nuts, seeds and berries.  Eat less sugar, salt, preservatives and processed foods.  Avoid toxins such as cigarettes and alcohol.  Drink water when you are thirsty. Warm water with a slice of lemon is an excellent morning drink to start the day.  Consider these websites for more information The Nutrition Source (https://www.henry-hernandez.biz/) Precision Nutrition (WindowBlog.ch)   RELAXATION Consider practicing mindfulness meditation or other relaxation techniques such as deep breathing, prayer, yoga, tai chi, massage. See website mindful.org or the apps Headspace or  Calm to help get started.   SLEEP Try to get at least 7-8+ hours sleep per day. Regular exercise and reduced caffeine will help you sleep better. Practice good sleep hygeine techniques. See website sleep.org for more information.   PLANNING Prepare estate planning, living will, healthcare POA documents. Sometimes this is best planned with the help of an attorney. Theconversationproject.org and agingwithdignity.org are excellent resources.

## 2015-09-18 NOTE — Progress Notes (Signed)
[facility]  PATIENT: Courtney Reed DOB: [birth year]  REFERRING CLINICIAN:  HISTORY FROM: patient REASON FOR VISIT: follow up   HISTORICAL  CHIEF COMPLAINT:  Chief Complaint  Patient presents with  . Other    rm 6, Herpes zoster ophthalmicus, Parkinson's disease, "still having pain in right eye pain, soreness of R face"   . Follow-up    2 month    HISTORY OF PRESENT ILLNESS:   UPDATE 09/18/15: Since last visit, pain has reduced, but still mild sensitivity and tenderness in right eye. Not on gabapentin anymore.   UPDATE 07/14/15: Since last visit, unfortunately, had new right V1 trigeminal shingles attack on 06/21/15, in spite of shingles vaccine in 2014. Has been to ophthalmology, using eye drops. Now on gabapentin 200mg  TID. Patient not sure if she received anti-viral therapy at onset. Parkinson's dz is stable.  UPDATE 05/02/15: Since last visit, has moved to [place] (1 month ago). Enjoying the transition. Has great attitude and doing well. Still some balance issues but no falls.   UPDATE 03/28/14: Since last visit, no falls. Tolerating meds. Tremor stable. Posture more stooped. Still living independently, driving, taking care of her ADLs.  UPDATE 03/24/13: Since last visit, has been taking ropinirole 1mg  TID + carb/levo 25/100 1 tab TID. No on off fluctuations. No wearing off. Taking meds at 7am, 2:30pm, 7:30pm. Go to sleep at [place] [facility] with mild benefit.  UPDATE 09/21/12: Since last visit, taking ropinirole 2mg  TID and carb/levo (25/100) half tab TID. Tried full tab of carb/levo, but it makes her too sleepy. Fell down at home a few days ago. Now using 4 point cane.   UPDATE 03/04/12: Since last visit, taking ropinirole 2mg  TID consistently. Nausea has resolved. More tremor and balance difficulty. Has cut down on driving. She feels nervous with driving. She has had some panic attack episodes (chest pain, racing heart beat, sweating, shaking). Saw  cardiology.  UPDATE 09/03/11: Feels stable on ropinirole 2mg  TID. Sometimes take 1mg  at noon, if she has AM nausea. Had cholecystectomy, and nausea has improved. No falls. Enjoys gardening.  UPDATE 03/19/11: Doing well. Some tremor in left hand now. Some balance diff.  UPDATE 11/16/10: Tolerating ropinirole 0.5mg  TID.  Tremor getting worse.  More freezing with gait.  Fell at home, but blames this on her shoe getting stuck outside.    UPDATE 08/15/10: Doing about the same; tried ropinirole 0.25 TID but had to reduce due to nausea.  Still with tremor and balance diff.  PRIOR HPI (05/16/10): 82 year old right-handed female with history of hypothyroidism, here for evaluation of tremor since 2011. Patient reports gradual onset tremor, mainly in her right upper extremity, less in her left upper extremity, since 2011.  Tremor is most notable when she is relaxed, sitting and watching TV. The tremor improves and she is doing specific action such as using utensils or holding objects. Anxiety seems to worsen the tremor. She also reports balance difficulty. She fell down in the garden a few weeks ago.  She has had a decrease in her smell and taste abilities over several years. She has no sleep problems. Denies hallucinations, voice tremor, head tremor, swallowing difficulties. No family history of tremor.   REVIEW OF SYSTEMS: Full 14 system review of systems performed and negative except as per HPI.    ALLERGIES: Allergies  Allergen Reactions  . Sulfacetamide Sodium   . Sulfa Antibiotics Rash    HOME MEDICATIONS: Outpatient Medications Prior to Visit  Medication Sig Dispense Refill  . aspirin 81 MG tablet Take 81 mg by mouth daily. Reported on 07/14/2015    . Calcium-Magnesium-Zinc (CALCIUM-MAGNESUIUM-ZINC PO) Take by mouth daily. Reported on 07/14/2015    . carbidopa-levodopa (SINEMET IR) 25-100 MG tablet Take 1 tablet by mouth 3 (three) times daily. 270 tablet 4  . estrogens, conjugated, (PREMARIN) 0.625 MG  tablet Take 0.625 mg by mouth.    . levothyroxine (SYNTHROID, LEVOTHROID) 75 MCG tablet Take 75 mcg by mouth daily.    . Multiple Vitamins-Minerals (CENTRUM SILVER ULTRA WOMENS PO) Take by mouth. Reported on 07/14/2015    . Multiple Vitamins-Minerals (HAIR/SKIN/NAILS PO) Take by mouth. Reported on 07/14/2015    . rOPINIRole (REQUIP) 2 MG tablet Take 1 tablet (2 mg total) by mouth 3 (three) times daily. 270 tablet 4  . erythromycin ophthalmic ointment 3 (three) times daily. Apply to R eyelid three x daily    . gabapentin (NEURONTIN) 100 MG capsule Take 100 mg by mouth 3 (three) times daily.    Marland Kitchen ibuprofen (ADVIL,MOTRIN) 200 MG tablet Take 200 mg by mouth every 6 (six) hours as needed. Reported on 07/14/2015    . traMADol (ULTRAM) 50 MG tablet Take by mouth. As needed    . triamcinolone lotion (KENALOG) 0.1 % Reported on 07/14/2015     No facility-administered medications prior to visit.     PAST MEDICAL HISTORY: Past Medical History:  Diagnosis Date  . Gallstones   . Hypothyroidism   . Nausea   . Parkinson disease ([place])   . Shingles 06/21/15  . Thyroid disease     PAST SURGICAL HISTORY: Past Surgical History:  Procedure Laterality Date  . APPENDECTOMY  1950  . CHOLECYSTECTOMY  07/01/2011   Procedure: LAPAROSCOPIC CHOLECYSTECTOMY WITH INTRAOPERATIVE CHOLANGIOGRAM;  Surgeon: Marcello Moores A. Cornett, MD;  Location: [place];  Service: General;  Laterality: N/A;  . EYE SURGERY     both cataracts  . hysterecomy - unknown type    . THYROIDECTOMY      FAMILY HISTORY: Family History  Problem Relation Age of Onset  . Heart disease Father     Heart attack  . Heart attack    . Cancer    . Coronary artery disease      SOCIAL HISTORY:  Social History   Social History  . Marital status: Widowed    Spouse name: N/A  . Number of children: 3  . Years of education: N/A   Occupational History  . retired    Social History Main Topics  . Smoking status: Never Smoker  .  Smokeless tobacco: Never Used  . Alcohol use No  . Drug use: No  . Sexual activity: Not on file   Other Topics Concern  . Not on file   Social History Narrative  . No narrative on file    PHYSICAL EXAM  Vitals:   09/18/15 1350  BP: 117/61  Pulse: 72  Weight: 134 lb (60.8 kg)    Not recorded     Body mass index is 23 kg/m.   GENERAL EXAM: Patient in no distress.  CARDIOVASCULAR: Regular rate and rhythm, no murmurs, no carotid bruits  NEUROLOGIC: MENTAL STATUS: awake, alert, language fluent, comprehension intact, naming intact CRANIAL NERVE:  pupils equal and reactive to light, visual fields full to confrontation, extraocular muscles intact, no nystagmus, facial sensation --> SLIGHT HYPERSENSITIVE IN RIGHT V1; facial strength symmetric, uvula midline, shoulder shrug symmetric, tongue midline. POSITIVE MYERSON'S. MOTOR: Normal bulk and tone.  RARE RESTING TREMOR IN BUE (RUE > LUE). NO RIGIDITY. MILD MOUTH TREMOR. BRADYKINESIA IN LUE AND LLE. Full strength in the BUE, BLE SENSORY: normal and symmetric to light touch COORDINATION: finger-nose-finger, fine finger movements, heel-shin normal REFLEXES: deep tendon reflexes TRACE; ABSENT AT ANKLES.  GAIT/STATION: UNSTEADY GAIT; BUE TREMOR WHILE WALKING. SHORT STEPS. SLIGHT HESITATION WITH TURNS. STOOPED POSTURE.    DIAGNOSTIC DATA (LABS, IMAGING, TESTING) - I reviewed patient records, labs, notes, testing and imaging myself where available.  Lab Results  Component Value Date   WBC 8.0 06/28/2011   HGB 13.7 06/28/2011   HCT 40.1 06/28/2011   MCV 87.6 06/28/2011   PLT 227 06/28/2011      Component Value Date/Time   NA 133 (L) 06/28/2011 1330   K 4.2 06/28/2011 1330   CL 96 06/28/2011 1330   CO2 28 06/28/2011 1330   GLUCOSE 110 (H) 06/28/2011 1330   BUN 9 06/28/2011 1330   CREATININE 0.62 06/28/2011 1330   CALCIUM 9.4 06/28/2011 1330   PROT 7.0 06/28/2011 1330   ALBUMIN 3.8 06/28/2011 1330   AST 22 06/28/2011  1330   ALT 17 06/28/2011 1330   ALKPHOS 64 06/28/2011 1330   BILITOT 0.4 06/28/2011 1330   GFRNONAA 81 (L) 06/28/2011 1330   GFRAA >90 06/28/2011 1330   No results found for: CHOL Lab Results  Component Value Date   HGBA1C  03/10/2008    5.9 (NOTE)   The ADA recommends the following therapeutic goal for glycemic   control related to Hgb A1C measurement:   Goal of Therapy:   < 7.0% Hgb A1C   Reference: American Diabetes Association: Clinical Practice   Recommendations 2008, Diabetes Care,  2008, 31:(Suppl 1).   No results found for: VITAMINB12 Lab Results  Component Value Date   TSH 2.131 *Test methodology is 3rd generation TSH* 03/10/2008    06/08/10 MRI brain - chronic stable changes of microvascular ischemia and mild generalized atrophy. No significant change compared with MRI 03/09/08.   ASSESSMENT AND PLAN  82 y.o. right-handed female with hypothyroidism, here with resting tremor, bradykinesia and postural instability.  Symptoms and exam are consistent with Parkinson's disease --> stable.  Now with new right V1 shingles attack on 06/21/15 (herpes zoster ophthalmicus). Gradually improving.    Dx:  Herpes zoster ophthalmicus  Parkinson's disease ([place])  Gait difficulty   PLAN: - continue ropinirole 2mg  TID + carb/levo 25/100 1 tab TID   Return in about 6 months (around 03/17/2016).    Penni Bombard, MD [id number], [id number] PM Certified in Neurology, Neurophysiology and Neuroimaging  [facility] [address] [place], [place] [address] [phone]

## 2015-10-03 DIAGNOSIS — Z23 Encounter for immunization: Secondary | ICD-10-CM | POA: Diagnosis not present

## 2015-10-30 DIAGNOSIS — R1310 Dysphagia, unspecified: Secondary | ICD-10-CM | POA: Diagnosis not present

## 2015-12-23 ENCOUNTER — Emergency Department (HOSPITAL_BASED_OUTPATIENT_CLINIC_OR_DEPARTMENT_OTHER)
Admission: EM | Admit: 2015-12-23 | Discharge: 2015-12-24 | Disposition: A | Discharge status: Home / Self Care | Payer: Medicare Other | Attending: Emergency Medicine | Admitting: Emergency Medicine

## 2015-12-23 ENCOUNTER — Emergency Department (HOSPITAL_BASED_OUTPATIENT_CLINIC_OR_DEPARTMENT_OTHER): Payer: Medicare Other

## 2015-12-23 ENCOUNTER — Encounter (HOSPITAL_BASED_OUTPATIENT_CLINIC_OR_DEPARTMENT_OTHER): Payer: Self-pay | Admitting: Emergency Medicine

## 2015-12-23 DIAGNOSIS — G20 Parkinson's disease: Secondary | ICD-10-CM | POA: Diagnosis not present

## 2015-12-23 DIAGNOSIS — M549 Dorsalgia, unspecified: Secondary | ICD-10-CM | POA: Insufficient documentation

## 2015-12-23 DIAGNOSIS — Y9389 Activity, other specified: Secondary | ICD-10-CM | POA: Diagnosis not present

## 2015-12-23 DIAGNOSIS — S6992XA Unspecified injury of left wrist, hand and finger(s), initial encounter: Secondary | ICD-10-CM | POA: Diagnosis not present

## 2015-12-23 DIAGNOSIS — Z79899 Other long term (current) drug therapy: Secondary | ICD-10-CM | POA: Insufficient documentation

## 2015-12-23 DIAGNOSIS — S62647A Nondisplaced fracture of proximal phalanx of left little finger, initial encounter for closed fracture: Secondary | ICD-10-CM

## 2015-12-23 DIAGNOSIS — E039 Hypothyroidism, unspecified: Secondary | ICD-10-CM | POA: Diagnosis not present

## 2015-12-23 DIAGNOSIS — T148XXA Other injury of unspecified body region, initial encounter: Secondary | ICD-10-CM | POA: Diagnosis not present

## 2015-12-23 DIAGNOSIS — Z7982 Long term (current) use of aspirin: Secondary | ICD-10-CM | POA: Diagnosis not present

## 2015-12-23 DIAGNOSIS — M542 Cervicalgia: Secondary | ICD-10-CM | POA: Insufficient documentation

## 2015-12-23 DIAGNOSIS — W19XXXA Unspecified fall, initial encounter: Secondary | ICD-10-CM

## 2015-12-23 DIAGNOSIS — Y929 Unspecified place or not applicable: Secondary | ICD-10-CM | POA: Insufficient documentation

## 2015-12-23 DIAGNOSIS — W08XXXA Fall from other furniture, initial encounter: Secondary | ICD-10-CM | POA: Diagnosis not present

## 2015-12-23 DIAGNOSIS — M79646 Pain in unspecified finger(s): Secondary | ICD-10-CM | POA: Diagnosis not present

## 2015-12-23 DIAGNOSIS — Y999 Unspecified external cause status: Secondary | ICD-10-CM | POA: Diagnosis not present

## 2015-12-23 DIAGNOSIS — M79645 Pain in left finger(s): Secondary | ICD-10-CM | POA: Diagnosis not present

## 2015-12-23 MED ORDER — TRAMADOL HCL 50 MG PO TABS: 50.0000 mg | ORAL_TABLET | Freq: Four times a day (QID) | ORAL | 0 refills | Status: DC | PRN

## 2015-12-23 NOTE — ED Provider Notes (Signed)
[facility] DEPT [facility] Provider Note   CSN: [id number] Arrival date & time: 12/23/15  2042   By signing my name below, I, Dolores Hoose, attest that this documentation has been prepared under the direction and in the presence of Fredia Sorrow, MD . Electronically Signed: Dolores Hoose, Scribe. 12/23/2015. 9:20 PM.  History   Chief Complaint Chief Complaint  Patient presents with  . Fall   The history is provided by the patient. No language interpreter was used.    HPI Comments:  CRYSTALANN DEVENDORF is a 82 y.o. female with pmhx of parkinson's and hypothyroidism who presents to the Emergency Department complaining of sudden-onset constant unchanged left 5th digit pain s/p fall occurring today. Pt states that she fell forward off of a garden stool and braced herself with her hands. No modifying factors indicated. She reports associated lower back pain and neck pain. Pt denies any chills, fever, rhinorrhea, sore throat, visual disturbance, cough, SOB, CP, abdominal pain, diarrhea, nausea, vomiting, dysuria, hematuria, joint swelling, headache, bleeding problems or confusion.   Past Medical History:  Diagnosis Date  . Gallstones   . Hypothyroidism   . Nausea   . Parkinson disease ([place])   . Shingles 06/21/15  . Thyroid disease     Patient Active Problem List   Diagnosis Date Noted  . Parkinson's disease ([place]) 03/24/2013  . Chest discomfort 11/18/2011  . Dyspnea 11/18/2011    Past Surgical History:  Procedure Laterality Date  . APPENDECTOMY  1950  . CHOLECYSTECTOMY  07/01/2011   Procedure: LAPAROSCOPIC CHOLECYSTECTOMY WITH INTRAOPERATIVE CHOLANGIOGRAM;  Surgeon: Marcello Moores A. Cornett, MD;  Location: [facility];  Service: General;  Laterality: N/A;  . EYE SURGERY     both cataracts  . hysterecomy - unknown type    . THYROIDECTOMY      OB History    No data available       Home Medications    Prior to Admission medications   Medication Sig Start Date End  Date Taking? Authorizing Provider  aspirin 81 MG tablet Take 81 mg by mouth daily. Reported on 07/14/2015   Yes Historical Provider, MD  Calcium-Magnesium-Zinc (CALCIUM-MAGNESUIUM-ZINC PO) Take by mouth daily. Reported on 07/14/2015   Yes Historical Provider, MD  carbidopa-levodopa (SINEMET IR) 25-100 MG tablet Take 1 tablet by mouth 3 (three) times daily. 07/14/15  Yes Penni Bombard, MD  erythromycin ophthalmic ointment 3 (three) times daily. Apply to R eyelid three x daily 07/04/15  Yes Historical Provider, MD  estrogens, conjugated, (PREMARIN) 0.625 MG tablet Take 0.625 mg by mouth.   Yes Historical Provider, MD  levothyroxine (SYNTHROID, LEVOTHROID) 75 MCG tablet Take 75 mcg by mouth daily.   Yes Historical Provider, MD  Multiple Vitamins-Minerals (CENTRUM SILVER ULTRA WOMENS PO) Take by mouth. Reported on 07/14/2015   Yes Historical Provider, MD  rOPINIRole (REQUIP) 2 MG tablet Take 1 tablet (2 mg total) by mouth 3 (three) times daily. 07/14/15  Yes Penni Bombard, MD  gabapentin (NEURONTIN) 100 MG capsule Take 100 mg by mouth 3 (three) times daily. 07/05/15 07/04/16  Historical Provider, MD  Multiple Vitamins-Minerals (HAIR/SKIN/NAILS PO) Take by mouth. Reported on 07/14/2015    Historical Provider, MD  traMADol (ULTRAM) 50 MG tablet Take 1 tablet (50 mg total) by mouth every 6 (six) hours as needed. 12/23/15   Fredia Sorrow, MD    Family History Family History  Problem Relation Age of Onset  . Heart disease Father     Heart attack  .  Heart attack    . Cancer    . Coronary artery disease      Social History Social History  Substance Use Topics  . Smoking status: Never Smoker  . Smokeless tobacco: Never Used  . Alcohol use No     Allergies   Sulfacetamide sodium and Sulfa antibiotics   Review of Systems Review of Systems  Constitutional: Negative for chills and fever.  HENT: Negative for rhinorrhea and sore throat.   Eyes: Negative for visual disturbance.  Respiratory:  Negative for cough and shortness of breath.   Cardiovascular: Negative for chest pain.  Gastrointestinal: Negative for abdominal pain, diarrhea, nausea and vomiting.  Genitourinary: Negative for dysuria and hematuria.  Musculoskeletal: Positive for arthralgias, back pain and neck pain. Negative for joint swelling.  Skin: Negative for rash.  Neurological: Negative for headaches.  Hematological: Does not bruise/bleed easily.  Psychiatric/Behavioral: Negative for confusion.     Physical Exam Updated Vital Signs BP 136/84 (BP Location: Right Arm)   Pulse 74   Temp 97.7 F (36.5 C) (Oral)   Resp 18   Ht 5' 5.5" (1.664 m)   Wt 61.2 kg   SpO2 98%   BMI 22.12 kg/m   Physical Exam  Constitutional: She is oriented to person, place, and time. She appears well-developed and well-nourished. No distress.  HENT:  Head: Normocephalic and atraumatic.  Mouth/Throat: Oropharynx is clear and moist.  Eyes: Conjunctivae and EOM are normal. Pupils are equal, round, and reactive to light. No scleral icterus.  Cardiovascular: Normal rate, regular rhythm and normal heart sounds.   Pulses:      Radial pulses are 2+ on the left side.  Pulmonary/Chest: Effort normal and breath sounds normal.  Abdominal: Soft. Bowel sounds are normal. She exhibits no distension. There is no tenderness.  Musculoskeletal: She exhibits edema and tenderness.  No swelling in ankles. Swelling and tenderness at PIP joint of left fifth finger. DIP joint normal.  Neurological: She is alert and oriented to person, place, and time. No cranial nerve deficit or sensory deficit. She exhibits normal muscle tone. Coordination normal.  Skin: Skin is warm and dry. Capillary refill takes less than 2 seconds.  Psychiatric: She has a normal mood and affect.  Nursing note and vitals reviewed.    ED Treatments / Results  DIAGNOSTIC STUDIES:  Oxygen Saturation is 98% on RA, normal by my interpretation.    COORDINATION OF CARE:  10:21  PM Discussed treatment plan with pt at bedside which includes pain medication and splint and pt agreed to plan.  Labs (all labs ordered are listed, but only abnormal results are displayed) Labs Reviewed - No data to display  EKG  EKG Interpretation None      Radiology Dg Finger Little Left  Result Date: 12/23/2015 CLINICAL DATA:  82 year old female with fall and pain in the fifth digit. EXAM: LEFT LITTLE FINGER 2+V COMPARISON:  None. FINDINGS: There is an oblique lucency extending from the distal portion of the proximal phalanx of the fifth digit into the PIP joint. This is age indeterminate but appears to have areas of corticated margins an is felt to represent chronic changes, although an acute fracture is not entirely excluded. There is severe erosive arthritic changes of the PIP joint. There is narrowing of the DIP joint space. No dislocation. The bones are osteopenic. There is soft tissue swelling of the fifth digit. No radiopaque foreign object identified. IMPRESSION: Severe arthritic changes of the PIP of the fifth digit. Oblique lucency  extending from the distal portion of the proximal phalanx of the fifth digit into the PIP joint is age indeterminate and although may be chronic, an acute fracture is not entirely excluded. No dislocation. Osteopenia. Electronically Signed   By: Anner Crete M.D.   On: 12/23/2015 21:20    Procedures Procedures (including critical care time)  Medications Ordered in ED Medications - No data to display   Initial Impression / Assessment and Plan / ED Course  I have reviewed the triage vital signs and the nursing notes.  Pertinent labs & imaging results that were available during my care of the patient were reviewed by me and considered in my medical decision making (see chart for details).  Clinical Course     Patient status post fall from stool. No loss of consciousness. Patient was close to the ground. Did not fall from significant height.  Patient's main complaint is left little finger which is swollen and tender. Clinically consistent with fracture x-rays raise concerns for a lucency consistent with fracture will treat as such with finger splint and follow-up with sports medicine. No other specific complaints other than some right-sided low back pain. No point tenderness over her vertebrae. No significant posterior neck tenderness. No other complaints.  Final Clinical Impressions(s) / ED Diagnoses   Final diagnoses:  Fall, initial encounter  Closed nondisplaced fracture of proximal phalanx of left little finger, initial encounter    New Prescriptions New Prescriptions   TRAMADOL (ULTRAM) 50 MG TABLET    Take 1 tablet (50 mg total) by mouth every 6 (six) hours as needed.  I personally performed the services described in this documentation, which was scribed in my presence. The recorded information has been reviewed and is accurate.       Fredia Sorrow, MD 12/23/15 2229

## 2015-12-23 NOTE — ED Notes (Signed)
Patient transported to X-ray 

## 2015-12-23 NOTE — ED Triage Notes (Signed)
Per Ems, was sitting on a stool removing her hoses and fell off, no LOC, around 1900, pain to left 5th finger. BP 150/90, HR 72

## 2015-12-23 NOTE — Discharge Instructions (Signed)
Elevate her left hand is much as possible. Make an appointment to follow-up with sports medicine. Take the tramadol as needed for pain. Keep the finger splint on until cleared by sports medicine for it to be removed.

## 2015-12-23 NOTE — ED Notes (Signed)
Waiting for transportation from [place]

## 2015-12-26 ENCOUNTER — Other Ambulatory Visit: Payer: Self-pay | Admitting: Diagnostic Neuroimaging

## 2016-01-05 DIAGNOSIS — B0239 Other herpes zoster eye disease: Secondary | ICD-10-CM | POA: Diagnosis not present

## 2016-01-05 DIAGNOSIS — H04123 Dry eye syndrome of bilateral lacrimal glands: Secondary | ICD-10-CM | POA: Diagnosis not present

## 2016-01-30 DIAGNOSIS — B369 Superficial mycosis, unspecified: Secondary | ICD-10-CM | POA: Diagnosis not present

## 2016-01-30 DIAGNOSIS — I10 Essential (primary) hypertension: Secondary | ICD-10-CM | POA: Diagnosis not present

## 2016-01-30 DIAGNOSIS — B0229 Other postherpetic nervous system involvement: Secondary | ICD-10-CM | POA: Diagnosis not present

## 2016-03-12 DIAGNOSIS — G20 Parkinson's disease: Secondary | ICD-10-CM | POA: Diagnosis not present

## 2016-03-12 DIAGNOSIS — R1310 Dysphagia, unspecified: Secondary | ICD-10-CM | POA: Diagnosis not present

## 2016-03-12 DIAGNOSIS — E039 Hypothyroidism, unspecified: Secondary | ICD-10-CM | POA: Diagnosis not present

## 2016-03-12 DIAGNOSIS — N951 Menopausal and female climacteric states: Secondary | ICD-10-CM | POA: Diagnosis not present

## 2016-03-18 ENCOUNTER — Ambulatory Visit: Payer: Medicare Other | Admitting: Diagnostic Neuroimaging

## 2016-04-02 ENCOUNTER — Ambulatory Visit (INDEPENDENT_AMBULATORY_CARE_PROVIDER_SITE_OTHER): Payer: Medicare Other | Admitting: Diagnostic Neuroimaging

## 2016-04-02 ENCOUNTER — Encounter: Payer: Self-pay | Admitting: Diagnostic Neuroimaging

## 2016-04-02 ENCOUNTER — Encounter (INDEPENDENT_AMBULATORY_CARE_PROVIDER_SITE_OTHER): Payer: Self-pay

## 2016-04-02 VITALS — BP 148/75 | HR 80 | Wt 138.0 lb

## 2016-04-02 DIAGNOSIS — B0230 Zoster ocular disease, unspecified: Secondary | ICD-10-CM | POA: Diagnosis not present

## 2016-04-02 DIAGNOSIS — G20 Parkinson's disease: Secondary | ICD-10-CM

## 2016-04-02 DIAGNOSIS — R269 Unspecified abnormalities of gait and mobility: Secondary | ICD-10-CM

## 2016-04-02 MED ORDER — CARBIDOPA-LEVODOPA 25-100 MG PO TABS: 1.0000 | ORAL_TABLET | Freq: Three times a day (TID) | ORAL | 4 refills | Status: DC

## 2016-04-02 MED ORDER — ROPINIROLE HCL 2 MG PO TABS: 2.0000 mg | ORAL_TABLET | Freq: Three times a day (TID) | ORAL | 4 refills | Status: DC

## 2016-04-02 NOTE — Progress Notes (Signed)
[facility]  PATIENT: Courtney Reed DOB: 1926-02-16  REFERRING CLINICIAN:  HISTORY FROM: patient REASON FOR VISIT: follow up   HISTORICAL  CHIEF COMPLAINT:  Chief Complaint  Patient presents with  . Herpes zoster opthalmicus    rm 7  . Parkinson's disease  . Follow-up    6 month    HISTORY OF PRESENT ILLNESS:   UPDATE 04/02/16: Since last visit, doing well. No new issues. Trying to stay active. No falls. Shingles pain resolved. Doing well at [facility].   UPDATE 09/18/15: Since last visit, pain has reduced, but still mild sensitivity and tenderness in right eye. Not on gabapentin anymore.   UPDATE 07/14/15: Since last visit, unfortunately, had new right V1 trigeminal shingles attack on 06/21/15, in spite of shingles vaccine in 2014. Has been to ophthalmology, using eye drops. Now on gabapentin 200mg  TID. Patient not sure if she received anti-viral therapy at onset. Parkinson's dz is stable.  UPDATE 05/02/15: Since last visit, has moved to [place] (1 month ago). Enjoying the transition. Has great attitude and doing well. Still some balance issues but no falls.   UPDATE 03/28/14: Since last visit, no falls. Tolerating meds. Tremor stable. Posture more stooped. Still living independently, driving, taking care of her ADLs.  UPDATE 03/24/13: Since last visit, has been taking ropinirole 1mg  TID + carb/levo 25/100 1 tab TID. No on off fluctuations. No wearing off. Taking meds at 7am, 2:30pm, 7:30pm. Go to sleep at [facility] with mild benefit.  UPDATE 09/21/12: Since last visit, taking ropinirole 2mg  TID and carb/levo (25/100) half tab TID. Tried full tab of carb/levo, but it makes her too sleepy. Fell down at home a few days ago. Now using 4 point cane.   UPDATE 03/04/12: Since last visit, taking ropinirole 2mg  TID consistently. Nausea has resolved. More tremor and balance difficulty. Has cut down on driving. She feels nervous with driving. She has had  some panic attack episodes (chest pain, racing heart beat, sweating, shaking). Saw cardiology.  UPDATE 09/03/11: Feels stable on ropinirole 2mg  TID. Sometimes take 1mg  at noon, if she has AM nausea. Had cholecystectomy, and nausea has improved. No falls. Enjoys gardening.  UPDATE 03/19/11: Doing well. Some tremor in left hand now. Some balance diff.  UPDATE 11/16/10: Tolerating ropinirole 0.5mg  TID.  Tremor getting worse.  More freezing with gait.  Fell at home, but blames this on her shoe getting stuck outside.    UPDATE 08/15/10: Doing about the same; tried ropinirole 0.25 TID but had to reduce due to nausea.  Still with tremor and balance diff.  PRIOR HPI (05/16/10): 83 year old right-handed female with history of hypothyroidism, here for evaluation of tremor since 2011. Patient reports gradual onset tremor, mainly in her right upper extremity, less in her left upper extremity, since 2011.  Tremor is most notable when she is relaxed, sitting and watching TV. The tremor improves and she is doing specific action such as using utensils or holding objects. Anxiety seems to worsen the tremor. She also reports balance difficulty. She fell down in the garden a few weeks ago.  She has had a decrease in her smell and taste abilities over several years. She has no sleep problems. Denies hallucinations, voice tremor, head tremor, swallowing difficulties. No family history of tremor.   REVIEW OF SYSTEMS: Full 14 system review of systems performed and negative except: eye discharge eye itching light sens walking diff.    ALLERGIES: Allergies  Allergen Reactions  .  Sulfacetamide Sodium   . Sulfa Antibiotics Rash    HOME MEDICATIONS: Outpatient Medications Prior to Visit  Medication Sig Dispense Refill  . aspirin 81 MG tablet Take 81 mg by mouth daily. Reported on 07/14/2015    . Calcium-Magnesium-Zinc (CALCIUM-MAGNESUIUM-ZINC PO) Take by mouth daily. Reported on 07/14/2015    . carbidopa-levodopa (SINEMET IR)  25-100 MG tablet Take 1 tablet by mouth 3 (three) times daily. 270 tablet 4  . erythromycin ophthalmic ointment 3 (three) times daily. Apply to R eyelid three x daily    . estrogens, conjugated, (PREMARIN) 0.625 MG tablet Take 0.625 mg by mouth.    . gabapentin (NEURONTIN) 100 MG capsule Take 100 mg by mouth 3 (three) times daily.    Marland Kitchen levothyroxine (SYNTHROID, LEVOTHROID) 75 MCG tablet Take 75 mcg by mouth daily.    . Multiple Vitamins-Minerals (CENTRUM SILVER ULTRA WOMENS PO) Take by mouth. Reported on 07/14/2015    . Multiple Vitamins-Minerals (HAIR/SKIN/NAILS PO) Take by mouth. Reported on 07/14/2015    . rOPINIRole (REQUIP) 2 MG tablet Take 1 tablet (2 mg total) by mouth 3 (three) times daily. 270 tablet 4  . traMADol (ULTRAM) 50 MG tablet Take 1 tablet (50 mg total) by mouth every 6 (six) hours as needed. 15 tablet 0   No facility-administered medications prior to visit.     PAST MEDICAL HISTORY: Past Medical History:  Diagnosis Date  . Gallstones   . Hypothyroidism   . Nausea   . Parkinson disease ([place])   . Shingles 06/21/15  . Thyroid disease     PAST SURGICAL HISTORY: Past Surgical History:  Procedure Laterality Date  . APPENDECTOMY  1950  . CHOLECYSTECTOMY  07/01/2011   Procedure: LAPAROSCOPIC CHOLECYSTECTOMY WITH INTRAOPERATIVE CHOLANGIOGRAM;  Surgeon: Marcello Moores A. Cornett, MD;  Location: [facility];  Service: General;  Laterality: N/A;  . EYE SURGERY     both cataracts  . hysterecomy - unknown type    . THYROIDECTOMY      FAMILY HISTORY: Family History  Problem Relation Age of Onset  . Heart disease Father     Heart attack  . Heart attack    . Cancer    . Coronary artery disease      SOCIAL HISTORY:  Social History   Social History  . Marital status: Widowed    Spouse name: N/A  . Number of children: 3  . Years of education: N/A   Occupational History  . retired    Social History Main Topics  . Smoking status: Never Smoker  . Smokeless  tobacco: Never Used  . Alcohol use No  . Drug use: No  . Sexual activity: Not on file   Other Topics Concern  . Not on file   Social History Narrative  . No narrative on file    PHYSICAL EXAM  Vitals:   04/02/16 1252  BP: (!) 148/75  Pulse: 80  Weight: 138 lb (62.6 kg)    Not recorded     Body mass index is 22.62 kg/m.   GENERAL EXAM: Patient in no distress.  CARDIOVASCULAR: Regular rate and rhythm, no murmurs, no carotid bruits  NEUROLOGIC: MENTAL STATUS: awake, alert, language fluent, comprehension intact, naming intact CRANIAL NERVE:  pupils equal and reactive to light, visual fields full to confrontation, extraocular muscles intact, no nystagmus, facial sensation --> SLIGHT HYPERSENSITIVE IN RIGHT V1; facial strength symmetric, uvula midline, shoulder shrug symmetric, tongue midline. POSITIVE MYERSON'S. MOTOR: Normal bulk and tone.  RARE RESTING TREMOR IN  LUE; MILD LEFT ARM RIGIDITY. MILD MOUTH TREMOR. BRADYKINESIA IN LUE AND LLE. Full strength in the BUE, BLE SENSORY: normal and symmetric to light touch COORDINATION: finger-nose-finger, fine finger movements normal REFLEXES: deep tendon reflexes TRACE; ABSENT AT ANKLES.  GAIT/STATION: UNSTEADY GAIT; SHORT STEPS. SLIGHT HESITATION WITH TURNS. STOOPED POSTURE.    DIAGNOSTIC DATA (LABS, IMAGING, TESTING) - I reviewed patient records, labs, notes, testing and imaging myself where available.  Lab Results  Component Value Date   WBC 8.0 06/28/2011   HGB 13.7 06/28/2011   HCT 40.1 06/28/2011   MCV 87.6 06/28/2011   PLT 227 06/28/2011      Component Value Date/Time   NA 133 (L) 06/28/2011 1330   K 4.2 06/28/2011 1330   CL 96 06/28/2011 1330   CO2 28 06/28/2011 1330   GLUCOSE 110 (H) 06/28/2011 1330   BUN 9 06/28/2011 1330   CREATININE 0.62 06/28/2011 1330   CALCIUM 9.4 06/28/2011 1330   PROT 7.0 06/28/2011 1330   ALBUMIN 3.8 06/28/2011 1330   AST 22 06/28/2011 1330   ALT 17 06/28/2011 1330   ALKPHOS 64  06/28/2011 1330   BILITOT 0.4 06/28/2011 1330   GFRNONAA 81 (L) 06/28/2011 1330   GFRAA >90 06/28/2011 1330   No results found for: CHOL Lab Results  Component Value Date   HGBA1C  03/10/2008    5.9 (NOTE)   The ADA recommends the following therapeutic goal for glycemic   control related to Hgb A1C measurement:   Goal of Therapy:   < 7.0% Hgb A1C   Reference: American Diabetes Association: Clinical Practice   Recommendations 2008, Diabetes Care,  2008, 31:(Suppl 1).   No results found for: VITAMINB12 Lab Results  Component Value Date   TSH 2.131 *Test methodology is 3rd generation TSH* 03/10/2008    06/08/10 MRI brain  - chronic stable changes of microvascular ischemia and mild generalized atrophy. No significant change compared with MRI 03/09/08.   ASSESSMENT AND PLAN  83 y.o. right-handed female with hypothyroidism, here with resting tremor, bradykinesia and postural instability.  Symptoms and exam are consistent with Parkinson's disease --> stable.  Right V1 shingles attack on 06/21/15 (herpes zoster ophthalmicus). Gradually improving.    Dx:  Parkinson's disease ([place])  Herpes zoster ophthalmicus  Gait difficulty   PLAN: I spent 15 minutes of face to face time with patient. Greater than 50% of time was spent in counseling and coordination of care with patient. In summary we discussed:  - continue ropinirole 2mg  TID + carb/levo 25/100 1 tab TID - continue physical activity  - caution with balance  Meds ordered this encounter  Medications  . carbidopa-levodopa (SINEMET IR) 25-100 MG tablet    Sig: Take 1 tablet by mouth 3 (three) times daily.    Dispense:  270 tablet    Refill:  4  . rOPINIRole (REQUIP) 2 MG tablet    Sig: Take 1 tablet (2 mg total) by mouth 3 (three) times daily.    Dispense:  270 tablet    Refill:  4   Return in about 1 year (around 04/02/2017).    Penni Bombard, MD 6/46/8032, 1:22 PM Certified in Neurology, Neurophysiology and  Neuroimaging  [facility] [address], [place], [place] [address] [phone]

## 2016-04-08 DIAGNOSIS — B0229 Other postherpetic nervous system involvement: Secondary | ICD-10-CM | POA: Diagnosis not present

## 2016-04-26 DIAGNOSIS — H02005 Unspecified entropion of left lower eyelid: Secondary | ICD-10-CM | POA: Diagnosis not present

## 2016-04-26 DIAGNOSIS — B0239 Other herpes zoster eye disease: Secondary | ICD-10-CM | POA: Diagnosis not present

## 2016-04-26 DIAGNOSIS — H04123 Dry eye syndrome of bilateral lacrimal glands: Secondary | ICD-10-CM | POA: Diagnosis not present

## 2016-05-01 ENCOUNTER — Ambulatory Visit: Payer: Medicare Other | Admitting: Diagnostic Neuroimaging

## 2016-07-29 DIAGNOSIS — B0239 Other herpes zoster eye disease: Secondary | ICD-10-CM | POA: Diagnosis not present

## 2016-07-29 DIAGNOSIS — H524 Presbyopia: Secondary | ICD-10-CM | POA: Diagnosis not present

## 2016-07-29 DIAGNOSIS — H04123 Dry eye syndrome of bilateral lacrimal glands: Secondary | ICD-10-CM | POA: Diagnosis not present

## 2016-07-29 DIAGNOSIS — H02005 Unspecified entropion of left lower eyelid: Secondary | ICD-10-CM | POA: Diagnosis not present

## 2016-08-16 ENCOUNTER — Ambulatory Visit (INDEPENDENT_AMBULATORY_CARE_PROVIDER_SITE_OTHER): Payer: Medicare Other | Admitting: Orthopaedic Surgery

## 2016-08-27 ENCOUNTER — Ambulatory Visit (INDEPENDENT_AMBULATORY_CARE_PROVIDER_SITE_OTHER): Payer: Medicare Other | Admitting: Orthopaedic Surgery

## 2016-08-27 ENCOUNTER — Encounter (INDEPENDENT_AMBULATORY_CARE_PROVIDER_SITE_OTHER): Payer: Self-pay | Admitting: Orthopaedic Surgery

## 2016-08-27 ENCOUNTER — Ambulatory Visit (INDEPENDENT_AMBULATORY_CARE_PROVIDER_SITE_OTHER): Payer: Medicare Other

## 2016-08-27 VITALS — Ht 62.0 in | Wt 135.0 lb

## 2016-08-27 DIAGNOSIS — M545 Low back pain: Secondary | ICD-10-CM

## 2016-08-27 DIAGNOSIS — G8929 Other chronic pain: Secondary | ICD-10-CM

## 2016-08-27 NOTE — Progress Notes (Signed)
Office Visit Note   Patient: Courtney Reed           Date of Birth: May 08, 1926           MRN: [id number] Visit Date: 08/27/2016              Requested by: Alroy Dust, L.Marlou Sa, [address] [employer] [address], [place] [address] PCP: Alroy Dust, L.Marlou Sa, MD   Assessment & Plan: Visit Diagnoses:  1. Chronic bilateral low back pain, with sciatica presence unspecified     Plan: Lumbar arthritis previous L2 compression fracture which is stable. She gets relief with occasional ibuprofen. At present she does not have radiculopathy and no claudication symptoms. Encouraged her to continue her calcium and vitamin D continue walking program and continue with [facility] exercise program at [facility] which includes balance classes, strengthening classes and fall prevention. Return as needed.  Follow-Up Instructions: No Follow-up on file.   Orders:  Orders Placed This Encounter  Procedures  . XR Lumbar Spine 2-3 Views   No orders of the defined types were placed in this encounter.     Procedures: No procedures performed   Clinical Data: No additional findings.   Subjective: Chief Complaint  Patient presents with  . Lower Back - Pain    HPI 83 year old female seen with chronic back pain she's noticed that she's been in a somewhat flexed position the lumbar region with pain that radiates across her back the lumbosacral junction occasionally rates and her buttocks. If she takes some ibuprofen she states she can stand up straight and walk better. Past history of L2 fracture 2016. She has been diagnosed with Parkinson's disease since last seen in 2016. She takes calcium and vitamin D. She denies associated bowel or bladder symptoms no fever chills. No recent falls.  Review of Systems's system positive for dyspepsia, mild scoliosis. Previous history compression fracture prior to 2016 at L2 level. No neurogenic claudication symptoms   Objective: Vital Signs: Ht 5\' 2"  (1.575 m)   Wt 135 lb  (61.2 kg)   BMI 24.69 kg/m   Physical Exam  Constitutional: She is oriented to person, place, and time. She appears well-developed.  HENT:  Head: Normocephalic.  Right Ear: External ear normal.  Left Ear: External ear normal.  Eyes: Pupils are equal, round, and reactive to light.  Neck: No tracheal deviation present. No thyromegaly present.  Cardiovascular: Normal rate.   Pulmonary/Chest: Effort normal.  Abdominal: Soft.  Musculoskeletal:  Patient has trace tremor. Hip flexion quads are strong. Anterior tib EHL is intact negative straight leg raising 90. Palpable distal pulses. She has increased discomfort for flexion does better with extension. When she first gets up she is in a flexed position and ribs and after several steps she begins to get more upright.  Neurological: She is alert and oriented to person, place, and time.  Skin: Skin is warm and dry.  Psychiatric: She has a normal mood and affect. Her behavior is normal.    Ortho Exam  Specialty Comments:  No specialty comments available.  Imaging: Xr Lumbar Spine 2-3 Views  Result Date: 08/27/2016 AP lateral lumbar x-rays demonstrate some scoliosis apex around L2 with lateral x-ray demonstrating some chronic compression of L2 consistent with her MRI in 2016. Impression: Mild scoliosis old compression of L2. There is facet arthropathy.    PMFS History: Patient Active Problem List   Diagnosis Date Noted  . Parkinson's disease ([place]) 03/24/2013  . Chest discomfort 11/18/2011  . Dyspnea  11/18/2011   Past Medical History:  Diagnosis Date  . Gallstones   . Hypothyroidism   . Nausea   . Parkinson disease ([place])   . Shingles 06/21/15  . Thyroid disease     Family History  Problem Relation Age of Onset  . Heart disease Father        Heart attack  . Heart attack Unknown   . Cancer Unknown   . Coronary artery disease Unknown     Past Surgical History:  Procedure Laterality Date  . APPENDECTOMY  1950  .  CHOLECYSTECTOMY  07/01/2011   Procedure: LAPAROSCOPIC CHOLECYSTECTOMY WITH INTRAOPERATIVE CHOLANGIOGRAM;  Surgeon: Marcello Moores A. Cornett, MD;  Location: [facility];  Service: General;  Laterality: N/A;  . EYE SURGERY     both cataracts  . hysterecomy - unknown type    . THYROIDECTOMY     Social History   Occupational History  . retired    Social History Main Topics  . Smoking status: Never Smoker  . Smokeless tobacco: Never Used  . Alcohol use No  . Drug use: No  . Sexual activity: Not on file

## 2016-10-10 DIAGNOSIS — Z23 Encounter for immunization: Secondary | ICD-10-CM | POA: Diagnosis not present

## 2016-12-03 DIAGNOSIS — M79671 Pain in right foot: Secondary | ICD-10-CM | POA: Diagnosis not present

## 2016-12-03 DIAGNOSIS — S92354A Nondisplaced fracture of fifth metatarsal bone, right foot, initial encounter for closed fracture: Secondary | ICD-10-CM | POA: Diagnosis not present

## 2016-12-06 DIAGNOSIS — S92354D Nondisplaced fracture of fifth metatarsal bone, right foot, subsequent encounter for fracture with routine healing: Secondary | ICD-10-CM | POA: Diagnosis not present

## 2016-12-11 DIAGNOSIS — S92354D Nondisplaced fracture of fifth metatarsal bone, right foot, subsequent encounter for fracture with routine healing: Secondary | ICD-10-CM | POA: Diagnosis not present

## 2016-12-25 DIAGNOSIS — S92354D Nondisplaced fracture of fifth metatarsal bone, right foot, subsequent encounter for fracture with routine healing: Secondary | ICD-10-CM | POA: Diagnosis not present

## 2017-01-01 DIAGNOSIS — S92354D Nondisplaced fracture of fifth metatarsal bone, right foot, subsequent encounter for fracture with routine healing: Secondary | ICD-10-CM | POA: Diagnosis not present

## 2017-01-01 DIAGNOSIS — L309 Dermatitis, unspecified: Secondary | ICD-10-CM | POA: Diagnosis not present

## 2017-01-15 DIAGNOSIS — S92354D Nondisplaced fracture of fifth metatarsal bone, right foot, subsequent encounter for fracture with routine healing: Secondary | ICD-10-CM | POA: Diagnosis not present

## 2017-02-12 DIAGNOSIS — S92354D Nondisplaced fracture of fifth metatarsal bone, right foot, subsequent encounter for fracture with routine healing: Secondary | ICD-10-CM | POA: Diagnosis not present

## 2017-03-05 DIAGNOSIS — H02004 Unspecified entropion of left upper eyelid: Secondary | ICD-10-CM | POA: Diagnosis not present

## 2017-03-05 DIAGNOSIS — H02005 Unspecified entropion of left lower eyelid: Secondary | ICD-10-CM | POA: Diagnosis not present

## 2017-03-05 DIAGNOSIS — H04123 Dry eye syndrome of bilateral lacrimal glands: Secondary | ICD-10-CM | POA: Diagnosis not present

## 2017-03-10 ENCOUNTER — Other Ambulatory Visit: Payer: Self-pay

## 2017-03-10 ENCOUNTER — Emergency Department (HOSPITAL_COMMUNITY)
Admission: EM | Admit: 2017-03-10 | Discharge: 2017-03-10 | Disposition: A | Discharge status: Home / Self Care | Payer: Medicare Other | Attending: Emergency Medicine | Admitting: Emergency Medicine

## 2017-03-10 ENCOUNTER — Encounter (HOSPITAL_COMMUNITY): Payer: Self-pay

## 2017-03-10 ENCOUNTER — Emergency Department (HOSPITAL_COMMUNITY): Payer: Medicare Other

## 2017-03-10 DIAGNOSIS — G20 Parkinson's disease: Secondary | ICD-10-CM | POA: Insufficient documentation

## 2017-03-10 DIAGNOSIS — Y999 Unspecified external cause status: Secondary | ICD-10-CM | POA: Diagnosis not present

## 2017-03-10 DIAGNOSIS — S0990XA Unspecified injury of head, initial encounter: Secondary | ICD-10-CM | POA: Diagnosis not present

## 2017-03-10 DIAGNOSIS — W19XXXA Unspecified fall, initial encounter: Secondary | ICD-10-CM

## 2017-03-10 DIAGNOSIS — S199XXA Unspecified injury of neck, initial encounter: Secondary | ICD-10-CM | POA: Diagnosis not present

## 2017-03-10 DIAGNOSIS — S0101XA Laceration without foreign body of scalp, initial encounter: Secondary | ICD-10-CM

## 2017-03-10 DIAGNOSIS — Y939 Activity, unspecified: Secondary | ICD-10-CM | POA: Diagnosis not present

## 2017-03-10 DIAGNOSIS — M542 Cervicalgia: Secondary | ICD-10-CM | POA: Diagnosis not present

## 2017-03-10 DIAGNOSIS — Z7982 Long term (current) use of aspirin: Secondary | ICD-10-CM | POA: Diagnosis not present

## 2017-03-10 DIAGNOSIS — E039 Hypothyroidism, unspecified: Secondary | ICD-10-CM | POA: Diagnosis not present

## 2017-03-10 DIAGNOSIS — R404 Transient alteration of awareness: Secondary | ICD-10-CM | POA: Diagnosis not present

## 2017-03-10 DIAGNOSIS — R55 Syncope and collapse: Secondary | ICD-10-CM | POA: Diagnosis not present

## 2017-03-10 DIAGNOSIS — Y92009 Unspecified place in unspecified non-institutional (private) residence as the place of occurrence of the external cause: Secondary | ICD-10-CM | POA: Insufficient documentation

## 2017-03-10 DIAGNOSIS — Z23 Encounter for immunization: Secondary | ICD-10-CM | POA: Insufficient documentation

## 2017-03-10 DIAGNOSIS — Z79899 Other long term (current) drug therapy: Secondary | ICD-10-CM | POA: Diagnosis not present

## 2017-03-10 MED ORDER — TETANUS-DIPHTH-ACELL PERTUSSIS 5-2.5-18.5 LF-MCG/0.5 IM SUSP
0.5000 mL | Freq: Once | INTRAMUSCULAR | Status: AC
  Administered 2017-03-10: 0.5 mL via INTRAMUSCULAR
  Filled 2017-03-10: qty 0.5

## 2017-03-10 MED ORDER — ACETAMINOPHEN 325 MG PO TABS
650.0000 mg | ORAL_TABLET | Freq: Once | ORAL | Status: AC
  Administered 2017-03-10: 650 mg via ORAL
  Filled 2017-03-10: qty 2

## 2017-03-10 MED ORDER — LIDOCAINE-EPINEPHRINE (PF) 2 %-1:200000 IJ SOLN
20.0000 mL | Freq: Once | INTRAMUSCULAR | Status: AC
  Administered 2017-03-10: 20 mL
  Filled 2017-03-10: qty 20

## 2017-03-10 NOTE — ED Provider Notes (Signed)
[facility] DEPT Provider Note   CSN: [id number] Arrival date & time: 03/10/17  1314     History   Chief Complaint Chief Complaint  Patient presents with  . Fall    HPI Courtney Reed is a 84 y.o. female with a h/o of Parkinson's disease and hypothyroidism who presents by EMS from [facility] after sustaining a fall.  The patient states that she was getting ready to go to lunch and reached out to get her earring box when she lost her balance.  She hit the back of her head on a windowsill.  She states that she thinks that she otherwise landed on the left side of her body.  She states that after the fall she crawled on all fours to the edge of the table and was able to pull herself up and walked into her bathroom to press pull the cord for assistance.   She denies LOC, but states that staff told EMS that "passed out" for less than 30 seconds. Patient states "I didn't pass out, they were just asking me a lot of questions. It just took me a few seconds to respond, but I answered all of their questions right." In the ED, she denies headache, visual changes, dizziness, lightheadedness. She states that she takes a baby aspirin daily, but took 4 this AM.  Spoke with Marcus from [facility] who responded to the call within approximately 2 minutes of call alter.  He states when he arrived, the patient seemed to "zone out" and didn't respond for 30 seconds when staff was asking her questions. She started slouching down in the chair so they slid her down to the floor. He stated that she continued to stare at the ceiling without responding for another 15-20 seconds while she laying flat on the floor with her legs elevated, but then began responding to questions appropriately.   The history is provided by the patient, the EMS personnel and the nursing home. No language interpreter was used.    Past Medical History:  Diagnosis Date  . Gallstones   . Hypothyroidism   .  Nausea   . Parkinson disease ([place])   . Shingles 06/21/15  . Thyroid disease     Patient Active Problem List   Diagnosis Date Noted  . Parkinson's disease ([place]) 03/24/2013  . Chest discomfort 11/18/2011  . Dyspnea 11/18/2011    Past Surgical History:  Procedure Laterality Date  . APPENDECTOMY  1950  . CHOLECYSTECTOMY  07/01/2011   Procedure: LAPAROSCOPIC CHOLECYSTECTOMY WITH INTRAOPERATIVE CHOLANGIOGRAM;  Surgeon: Marcello Moores A. Cornett, MD;  Location: [facility];  Service: General;  Laterality: N/A;  . EYE SURGERY     both cataracts  . hysterecomy - unknown type    . THYROIDECTOMY      OB History    No data available       Home Medications    Prior to Admission medications   Medication Sig Start Date End Date Taking? Authorizing Provider  aspirin 81 MG tablet Take 325 mg by mouth daily. Reported on 07/14/2015   Yes [provider]  Calcium-Magnesium-Zinc (CALCIUM-MAGNESUIUM-ZINC PO) Take by mouth daily. Reported on 07/14/2015   Yes [provider]  carbidopa-levodopa (SINEMET IR) 25-100 MG tablet Take 1 tablet by mouth 3 (three) times daily. 04/02/16  Yes Penumalli, Earlean Polka, MD  estradiol (ESTRACE) 0.5 MG tablet Take 0.5 mg by mouth daily.   Yes [provider]  hydrocortisone 2.5 % cream Apply  1 application topically 3 (three) times daily as needed (rash).  01/01/17  Yes [provider]  influenza vac split quadrivalent PF (FLUZONE QUADRIVALENT) 0.5 ML injection Inject 0.5 mLs into the muscle once.   Yes [provider]  levothyroxine (SYNTHROID, LEVOTHROID) 75 MCG tablet Take 75 mcg by mouth daily.   Yes [provider]  Multiple Vitamins-Minerals (CENTRUM SILVER ULTRA WOMENS PO) Take by mouth. Reported on 07/14/2015   Yes [provider]  Multiple Vitamins-Minerals (HAIR/SKIN/NAILS PO) Take by mouth. Reported on 07/14/2015   Yes [provider]  omeprazole (PRILOSEC) 20 MG capsule 20 mg daily.  03/25/16  Yes [provider]  rOPINIRole (REQUIP) 2 MG tablet Take 1 tablet (2 mg total) by mouth 3 (three) times daily. 04/02/16  Yes Penumalli, Earlean Polka, MD  gabapentin (NEURONTIN) 100 MG capsule Take 100 mg by mouth 3 (three) times daily. 07/05/15 07/04/16  [provider]    Family History Family History  Problem Relation Age of Onset  . Heart disease Father        Heart attack  . Heart attack Unknown   . Cancer Unknown   . Coronary artery disease Unknown     Social History Social History   Tobacco Use  . Smoking status: Never Smoker  . Smokeless tobacco: Never Used  Substance Use Topics  . Alcohol use: No  . Drug use: No     Allergies   Sulfacetamide sodium; Sulfa antibiotics; and Sulfasalazine   Review of Systems Review of Systems  Constitutional: Negative for activity change, chills and fever.  HENT: Negative for congestion.   Eyes: Negative for visual disturbance.  Respiratory: Negative for shortness of breath.   Cardiovascular: Negative for chest pain.  Gastrointestinal: Negative for abdominal pain, nausea and vomiting.  Genitourinary: Negative for dysuria and frequency.  Musculoskeletal: Negative for arthralgias, back pain, gait problem, neck pain and neck stiffness.  Skin: Positive for wound. Negative for rash.  Allergic/Immunologic: Negative for immunocompromised state.  Neurological: Negative for dizziness, seizures, weakness, light-headedness, numbness and headaches.  Psychiatric/Behavioral: Negative for confusion.     Physical Exam Updated Vital Signs BP (!) 184/80 (BP Location: Right Arm)   Pulse 76   Temp 97.6 F (36.4 C) (Oral)   Resp 18   Wt 61.2 kg (135 lb)   SpO2 96%   BMI 24.69 kg/m   Physical Exam  Constitutional: She is oriented to person, place, and time. No distress.  HENT:  Head: Normocephalic.  1 cm superficial laceration that is currently hemostatic noted to the posterior scalp.   Eyes: Conjunctivae and EOM  are normal. Pupils are equal, round, and reactive to light. No scleral icterus.  Neck: Normal range of motion. Neck supple.  Cardiovascular: Normal rate and regular rhythm. Exam reveals no gallop and no friction rub.  No murmur heard. Pulmonary/Chest: Effort normal. No respiratory distress. She exhibits no tenderness.  No tenderness to the bilateral clavicles or ribs.  Abdominal: Soft. She exhibits no distension and no mass. There is no tenderness. There is no rebound and no guarding. No hernia.  Musculoskeletal: Normal range of motion. She exhibits no edema, tenderness or deformity.  Head to toe exam.  No tenderness to the spinous processes of the cervical, thoracic, or lumbar spine or surrounding paraspinal muscles.  Bilateral hips and pelvis are nontender to palpation and appears stable.  Bilateral upper and lower extremities are non-tender to palpation.  No crepitus or step-offs.  Neurological: She is alert and oriented to  person, place, and time.  GCS 15. A&O x3.  Speaking in complete fluent sentences.  Follows commands appropriately.  Cranial nerves II through XII are grossly intact.  Normal finger to nose bilaterally.  5 out of 5 strength of the bilateral upper and lower extremities against resistance.  Sensation is intact throughout.  Shuffling gait, which is the patient's baseline.  Skin: Skin is warm. Capillary refill takes less than 2 seconds. No rash noted.  Psychiatric: Her behavior is normal.  Nursing note and vitals reviewed.    ED Treatments / Results  Labs (all labs ordered are listed, but only abnormal results are displayed) Labs Reviewed - No data to display  EKG  EKG Interpretation None       Radiology Ct Head Wo Contrast  Result Date: 03/10/2017 CLINICAL DATA:  Fall.  Head injury.  Neck pain EXAM: CT HEAD WITHOUT CONTRAST CT CERVICAL SPINE WITHOUT CONTRAST TECHNIQUE: Multidetector CT imaging of the head and cervical spine was performed following the standard  protocol without intravenous contrast. Multiplanar CT image reconstructions of the cervical spine were also generated. COMPARISON:  CT head 03/09/2008 FINDINGS: CT HEAD FINDINGS Brain: Mild atrophy and mild chronic microvascular ischemia, with progression since the prior study. Hypodensity in the left basal ganglia and internal capsule is new and could represent infarct of indeterminate age. Correlate with right-sided weakness. Negative for hemorrhage or mass. Vascular: Negative for hyperdense vessel Skull: Negative Sinuses/Orbits: Negative Other: None CT CERVICAL SPINE FINDINGS Alignment: Normal Skull base and vertebrae: Negative for fracture Soft tissues and spinal canal: Negative for mass or adenopathy. No soft tissue swelling. Disc levels: Mild disc and facet degeneration. Mild spurring at C4-5, C5-6, C6-7. Facet degeneration on the right C5-6 and on the left C3-4. No significant spinal stenosis Upper chest: Negative Other: None IMPRESSION: Atrophy and chronic microvascular ischemia. Hypodensity left internal capsule and basal ganglia compatible with infarction of indeterminate age. This was not present in 2010 and may be acute or chronic. No intracranial hemorrhage Negative for cervical spine fracture Electronically Signed   By: Franchot Gallo M.D.   On: 03/10/2017 15:19   Ct Cervical Spine Wo Contrast  Result Date: 03/10/2017 CLINICAL DATA:  Fall.  Head injury.  Neck pain EXAM: CT HEAD WITHOUT CONTRAST CT CERVICAL SPINE WITHOUT CONTRAST TECHNIQUE: Multidetector CT imaging of the head and cervical spine was performed following the standard protocol without intravenous contrast. Multiplanar CT image reconstructions of the cervical spine were also generated. COMPARISON:  CT head 03/09/2008 FINDINGS: CT HEAD FINDINGS Brain: Mild atrophy and mild chronic microvascular ischemia, with progression since the prior study. Hypodensity in the left basal ganglia and internal capsule is new and could represent infarct of  indeterminate age. Correlate with right-sided weakness. Negative for hemorrhage or mass. Vascular: Negative for hyperdense vessel Skull: Negative Sinuses/Orbits: Negative Other: None CT CERVICAL SPINE FINDINGS Alignment: Normal Skull base and vertebrae: Negative for fracture Soft tissues and spinal canal: Negative for mass or adenopathy. No soft tissue swelling. Disc levels: Mild disc and facet degeneration. Mild spurring at C4-5, C5-6, C6-7. Facet degeneration on the right C5-6 and on the left C3-4. No significant spinal stenosis Upper chest: Negative Other: None IMPRESSION: Atrophy and chronic microvascular ischemia. Hypodensity left internal capsule and basal ganglia compatible with infarction of indeterminate age. This was not present in 2010 and may be acute or chronic. No intracranial hemorrhage Negative for cervical spine fracture Electronically Signed   By: Franchot Gallo M.D.   On: 03/10/2017 15:19  Procedures .Marland KitchenLaceration Repair Date/Time: 03/10/2017 4:10 PM Performed by: Joanne Gavel, PA-C Authorized by: Joanne Gavel, PA-C   Consent:    Consent obtained:  Verbal   Consent given by:  Patient   Risks discussed:  Infection, pain, poor cosmetic result, poor wound healing and need for additional repair   Alternatives discussed:  No treatment Anesthesia (see MAR for exact dosages):    Anesthesia method:  None Laceration details:    Location:  Scalp   Length (cm):  1 Repair type:    Repair type:  Simple Pre-procedure details:    Preparation:  Imaging obtained to evaluate for foreign bodies and patient was prepped and draped in usual sterile fashion Exploration:    Hemostasis achieved with:  Direct pressure   Wound exploration: wound explored through full range of motion and entire depth of wound probed and visualized     Wound extent: no fascia violation noted, no foreign bodies/material noted, no muscle damage noted, no nerve damage noted, no tendon damage noted, no underlying  fracture noted and no vascular damage noted     Contaminated: no   Treatment:    Area cleansed with:  Saline   Amount of cleaning:  Standard   Irrigation solution:  Sterile saline   Irrigation method:  Pressure wash and syringe Skin repair:    Repair method:  Staples   Number of staples:  2 Approximation:    Approximation:  Close Post-procedure details:    Dressing:  Open (no dressing)   Patient tolerance of procedure:  Tolerated well, no immediate complications   (including critical care time)  Medications Ordered in ED Medications  acetaminophen (TYLENOL) tablet 650 mg (650 mg Oral Given 03/10/17 1550)  Tdap (BOOSTRIX) injection 0.5 mL (0.5 mLs Intramuscular Given 03/10/17 1552)  lidocaine-EPINEPHrine (XYLOCAINE W/EPI) 2 %-1:200000 (PF) injection 20 mL (20 mLs Infiltration Given by Other 03/10/17 1550)     Initial Impression / Assessment and Plan / ED Course  I have reviewed the triage vital signs and the nursing notes.  Pertinent labs & imaging results that were available during my care of the patient were reviewed by me and considered in my medical decision making (see chart for details).     84 year old female with a h/o of Parkinson's disease and hypothyroidism who presents by EMS from [facility] after she sustained a mechanical fall from standing.  The patient was seen and evaluated by Dr. Tamera Punt, attending physician.  On physical exam, she has a superficial laceration to the posterior scalp that is hemostatic. No other areas of pain or tenderness on head-to-toe exam. Cervical spine CT is negative. Head CT with hypodensity to the left internal capsule and basal ganglia compatible with infarction that was not present on previous imaging in 2010. On re-evaluation, she has 5 out of 5 symmetric strength of the bilateral upper and lower extremities.  Ambulatory with assistance, which is her baseline, with a shuffling gait. Doubt acute infarction. Tdap booster given. Pressure  irrigation performed. Laceration occurred < 8 hours prior to repair which was well tolerated. Pt has no co morbidities to effect normal wound healing. Discussed staple home care w pt and answered questions. Pt to f-u for wound check and suture removal in 7 days. Pt is hemodynamically stable w no complaints prior to dc.    Final Clinical Impressions(s) / ED Diagnoses   Final diagnoses:  Fall, initial encounter  Laceration of scalp, initial encounter    ED Discharge Orders  None       Joanne Gavel, PA-C 03/10/17 1622    Malvin Johns, MD 03/10/17 [phone]

## 2017-03-10 NOTE — ED Triage Notes (Addendum)
Pt BIB EMS from [facility]. Pt lost her balance and struck back of head on window seal in her room, crawled to bathroom where she pulled assistance cord. No LOC, no blood thinners, only small dose os asaprin daily. Facility did report a short syncopal episode after the fall. Hx of parkinsons. Neuro check passed with EMS, no neck or back trauma related to fall. Ambulatory to restroom, uses restroom independently.

## 2017-03-10 NOTE — Discharge Instructions (Signed)
Please have the 2 staples removed in 7 days.  You can return to the emergency department for staple removal, follow-up with your primary care provider, or go to the clinic at [facility].   Keep the wound clean with warm soap and water at least once daily.  Take 650 mg of Tylenol every 6 hours for pain control.    Wounds on the scalp have a very low infection rate, but if the area around the wound gets red, hot, swollen, or if you develop fever chills, please return to the emergency department for reevaluation.  If you develop new symptoms over the next few days, including a change in her vision, vomiting, the worst headache of your life, or new numbness or weakness, please return to the emergency department for re-evaluation.

## 2017-03-10 NOTE — ED Notes (Signed)
Bed: [facility] Expected date:  Expected time:  Means of arrival:  Comments: EMS fall  

## 2017-03-17 DIAGNOSIS — R2681 Unsteadiness on feet: Secondary | ICD-10-CM | POA: Diagnosis not present

## 2017-03-17 DIAGNOSIS — Z4802 Encounter for removal of sutures: Secondary | ICD-10-CM | POA: Diagnosis not present

## 2017-03-17 DIAGNOSIS — I8001 Phlebitis and thrombophlebitis of superficial vessels of right lower extremity: Secondary | ICD-10-CM | POA: Diagnosis not present

## 2017-03-17 DIAGNOSIS — M79604 Pain in right leg: Secondary | ICD-10-CM | POA: Diagnosis not present

## 2017-03-17 DIAGNOSIS — M7989 Other specified soft tissue disorders: Secondary | ICD-10-CM | POA: Diagnosis not present

## 2017-03-17 DIAGNOSIS — Z9181 History of falling: Secondary | ICD-10-CM | POA: Diagnosis not present

## 2017-03-19 DIAGNOSIS — Z9181 History of falling: Secondary | ICD-10-CM | POA: Diagnosis not present

## 2017-03-19 DIAGNOSIS — R293 Abnormal posture: Secondary | ICD-10-CM | POA: Diagnosis not present

## 2017-03-19 DIAGNOSIS — R2681 Unsteadiness on feet: Secondary | ICD-10-CM | POA: Diagnosis not present

## 2017-03-20 DIAGNOSIS — Z9181 History of falling: Secondary | ICD-10-CM | POA: Diagnosis not present

## 2017-03-20 DIAGNOSIS — R2681 Unsteadiness on feet: Secondary | ICD-10-CM | POA: Diagnosis not present

## 2017-03-20 DIAGNOSIS — R293 Abnormal posture: Secondary | ICD-10-CM | POA: Diagnosis not present

## 2017-03-24 DIAGNOSIS — Z9181 History of falling: Secondary | ICD-10-CM | POA: Diagnosis not present

## 2017-03-24 DIAGNOSIS — R293 Abnormal posture: Secondary | ICD-10-CM | POA: Diagnosis not present

## 2017-03-24 DIAGNOSIS — R2681 Unsteadiness on feet: Secondary | ICD-10-CM | POA: Diagnosis not present

## 2017-03-26 DIAGNOSIS — R2681 Unsteadiness on feet: Secondary | ICD-10-CM | POA: Diagnosis not present

## 2017-03-26 DIAGNOSIS — Z9181 History of falling: Secondary | ICD-10-CM | POA: Diagnosis not present

## 2017-03-26 DIAGNOSIS — R293 Abnormal posture: Secondary | ICD-10-CM | POA: Diagnosis not present

## 2017-03-27 DIAGNOSIS — Z9181 History of falling: Secondary | ICD-10-CM | POA: Diagnosis not present

## 2017-03-27 DIAGNOSIS — R293 Abnormal posture: Secondary | ICD-10-CM | POA: Diagnosis not present

## 2017-03-27 DIAGNOSIS — R2681 Unsteadiness on feet: Secondary | ICD-10-CM | POA: Diagnosis not present

## 2017-03-31 ENCOUNTER — Other Ambulatory Visit: Payer: Self-pay | Admitting: Diagnostic Neuroimaging

## 2017-03-31 DIAGNOSIS — N951 Menopausal and female climacteric states: Secondary | ICD-10-CM | POA: Diagnosis not present

## 2017-03-31 DIAGNOSIS — E039 Hypothyroidism, unspecified: Secondary | ICD-10-CM | POA: Diagnosis not present

## 2017-03-31 DIAGNOSIS — R1310 Dysphagia, unspecified: Secondary | ICD-10-CM | POA: Diagnosis not present

## 2017-03-31 DIAGNOSIS — G20 Parkinson's disease: Secondary | ICD-10-CM | POA: Diagnosis not present

## 2017-04-01 DIAGNOSIS — Z9181 History of falling: Secondary | ICD-10-CM | POA: Diagnosis not present

## 2017-04-01 DIAGNOSIS — R2681 Unsteadiness on feet: Secondary | ICD-10-CM | POA: Diagnosis not present

## 2017-04-01 DIAGNOSIS — R293 Abnormal posture: Secondary | ICD-10-CM | POA: Diagnosis not present

## 2017-04-02 ENCOUNTER — Encounter: Payer: Self-pay | Admitting: Diagnostic Neuroimaging

## 2017-04-02 ENCOUNTER — Ambulatory Visit (INDEPENDENT_AMBULATORY_CARE_PROVIDER_SITE_OTHER): Payer: Medicare Other | Admitting: Diagnostic Neuroimaging

## 2017-04-02 VITALS — BP 138/70 | HR 71 | Ht 62.0 in | Wt 138.5 lb

## 2017-04-02 DIAGNOSIS — R269 Unspecified abnormalities of gait and mobility: Secondary | ICD-10-CM

## 2017-04-02 DIAGNOSIS — G20 Parkinson's disease: Secondary | ICD-10-CM

## 2017-04-02 MED ORDER — CARBIDOPA-LEVODOPA 25-100 MG PO TABS: 1.0000 | ORAL_TABLET | Freq: Three times a day (TID) | ORAL | 4 refills | Status: DC

## 2017-04-02 MED ORDER — ROPINIROLE HCL 2 MG PO TABS: 2.0000 mg | ORAL_TABLET | Freq: Three times a day (TID) | ORAL | 4 refills | Status: DC

## 2017-04-02 NOTE — Progress Notes (Signed)
[facility]  PATIENT: Courtney Reed DOB: [birth year]  REFERRING CLINICIAN:  HISTORY FROM: patient REASON FOR VISIT: follow up   HISTORICAL  CHIEF COMPLAINT:  Chief Complaint  Patient presents with  . Follow-up  . Parkinsons Disease    doing ok,     HISTORY OF PRESENT ILLNESS:   UPDATE (04/02/17, VRP): Since last visit, now having more balance issues. Trying PT. Has had more falls. Tolerating meds. No alleviating or aggravating factors.   UPDATE 04/02/16: Since last visit, doing well. No new issues. Trying to stay active. No falls. Shingles pain resolved. Doing well at [facility].   UPDATE 09/18/15: Since last visit, pain has reduced, but still mild sensitivity and tenderness in right eye. Not on gabapentin anymore.   UPDATE 07/14/15: Since last visit, unfortunately, had new right V1 trigeminal shingles attack on 06/21/15, in spite of shingles vaccine in 2014. Has been to ophthalmology, using eye drops. Now on gabapentin 200mg  TID. Patient not sure if she received anti-viral therapy at onset. Parkinson's dz is stable.  UPDATE 05/02/15: Since last visit, has moved to [facility] (1 month ago). Enjoying the transition. Has great attitude and doing well. Still some balance issues but no falls.   UPDATE 03/28/14: Since last visit, no falls. Tolerating meds. Tremor stable. Posture more stooped. Still living independently, driving, taking care of her ADLs.  UPDATE 03/24/13: Since last visit, has been taking ropinirole 1mg  TID + carb/levo 25/100 1 tab TID. No on off fluctuations. No wearing off. Taking meds at 7am, 2:30pm, 7:30pm. Go to sleep at [facility] with mild benefit.  UPDATE 09/21/12: Since last visit, taking ropinirole 2mg  TID and carb/levo (25/100) half tab TID. Tried full tab of carb/levo, but it makes her too sleepy. Fell down at home a few days ago. Now using 4 point cane.   UPDATE 03/04/12: Since last visit, taking ropinirole 2mg  TID  consistently. Nausea has resolved. More tremor and balance difficulty. Has cut down on driving. She feels nervous with driving. She has had some panic attack episodes (chest pain, racing heart beat, sweating, shaking). Saw cardiology.  UPDATE 09/03/11: Feels stable on ropinirole 2mg  TID. Sometimes take 1mg  at noon, if she has AM nausea. Had cholecystectomy, and nausea has improved. No falls. Enjoys gardening.  UPDATE 03/19/11: Doing well. Some tremor in left hand now. Some balance diff.  UPDATE 11/16/10: Tolerating ropinirole 0.5mg  TID.  Tremor getting worse.  More freezing with gait.  Fell at home, but blames this on her shoe getting stuck outside.    UPDATE 08/15/10: Doing about the same; tried ropinirole 0.25 TID but had to reduce due to nausea.  Still with tremor and balance diff.  PRIOR HPI (05/16/10): 84 year old right-handed female with history of hypothyroidism, here for evaluation of tremor since 2011. Patient reports gradual onset tremor, mainly in her right upper extremity, less in her left upper extremity, since 2011.  Tremor is most notable when she is relaxed, sitting and watching TV. The tremor improves and she is doing specific action such as using utensils or holding objects. Anxiety seems to worsen the tremor. She also reports balance difficulty. She fell down in the garden a few weeks ago.  She has had a decrease in her smell and taste abilities over several years. She has no sleep problems. Denies hallucinations, voice tremor, head tremor, swallowing difficulties. No family history of tremor.   REVIEW OF SYSTEMS: Full 14 system review of systems performed and negative except:  back pain walking diff eye itching.    ALLERGIES: Allergies  Allergen Reactions  . Sulfa Antibiotics Rash  . Sulfacetamide Sodium Rash  . Sulfasalazine Rash    HOME MEDICATIONS: Outpatient Medications Prior to Visit  Medication Sig Dispense Refill  . aspirin 81 MG tablet 2 tabs daily prn    .  Calcium-Magnesium-Zinc (CALCIUM-MAGNESUIUM-ZINC PO) Take 1 tablet by mouth daily. Reported on 07/14/2015    . carbidopa-levodopa (SINEMET IR) 25-100 MG tablet Take 1 tablet by mouth 3 (three) times daily. 270 tablet 4  . estradiol (ESTRACE) 0.5 MG tablet Take 0.5 mg by mouth daily.    Marland Kitchen levothyroxine (SYNTHROID, LEVOTHROID) 75 MCG tablet Take 75 mcg by mouth daily.    . Multiple Vitamins-Minerals (HAIR/SKIN/NAILS PO) Take by mouth. Reported on 07/14/2015    . Multiple Vitamins-Minerals (MULTIVITAMIN ADULT PO) Take by mouth. Hand, skin and nail once daily    . omeprazole (PRILOSEC) 20 MG capsule 20 mg daily.    Marland Kitchen rOPINIRole (REQUIP) 2 MG tablet Take 1 tablet (2 mg total) by mouth 3 (three) times daily. 270 tablet 4  . gabapentin (NEURONTIN) 100 MG capsule Take 100 mg by mouth 3 (three) times daily.    . hydrocortisone 2.5 % cream Apply 1 application topically 3 (three) times daily as needed (rash).     . influenza vac split quadrivalent PF (FLUZONE QUADRIVALENT) 0.5 ML injection Inject 0.5 mLs into the muscle once.    . Multiple Vitamins-Minerals (CENTRUM SILVER ULTRA WOMENS PO) Take by mouth. Reported on 07/14/2015     No facility-administered medications prior to visit.     PAST MEDICAL HISTORY: Past Medical History:  Diagnosis Date  . Gallstones   . Hypothyroidism   . Nausea   . Parkinson disease ([place])   . Shingles 06/21/15  . Thyroid disease     PAST SURGICAL HISTORY: Past Surgical History:  Procedure Laterality Date  . APPENDECTOMY  1950  . CHOLECYSTECTOMY  07/01/2011   Procedure: LAPAROSCOPIC CHOLECYSTECTOMY WITH INTRAOPERATIVE CHOLANGIOGRAM;  Surgeon: Marcello Moores A. Cornett, MD;  Location: [facility];  Service: General;  Laterality: N/A;  . EYE SURGERY     both cataracts  . hysterecomy - unknown type    . THYROIDECTOMY      FAMILY HISTORY: Family History  Problem Relation Age of Onset  . Heart disease Father        Heart attack  . Heart attack Unknown   . Cancer  Unknown   . Coronary artery disease Unknown     SOCIAL HISTORY:  Social History   Socioeconomic History  . Marital status: Widowed    Spouse name: Not on file  . Number of children: 3  . Years of education: Not on file  . Highest education level: Not on file  Occupational History  . Occupation: retired  Scientific laboratory technician  . Financial resource strain: Not on file  . Food insecurity:    Worry: Not on file    Inability: Not on file  . Transportation needs:    Medical: Not on file    Non-medical: Not on file  Tobacco Use  . Smoking status: Never Smoker  . Smokeless tobacco: Never Used  Substance and Sexual Activity  . Alcohol use: No  . Drug use: No  . Sexual activity: Not on file  Lifestyle  . Physical activity:    Days per week: Not on file    Minutes per session: Not on file  . Stress: Not on file  Relationships  . Social connections:    Talks on phone: Not on file    Gets together: Not on file    Attends religious service: Not on file    Active member of club or organization: Not on file    Attends meetings of clubs or organizations: Not on file    Relationship status: Not on file  . Intimate partner violence:    Fear of current or ex partner: Not on file    Emotionally abused: Not on file    Physically abused: Not on file    Forced sexual activity: Not on file  Other Topics Concern  . Not on file  Social History Narrative  . Not on file    PHYSICAL EXAM  Vitals:   04/02/17 1413  BP: 138/70  Pulse: 71  Weight: 138 lb 8 oz (62.8 kg)  Height: 5\' 2"  (1.575 m)    Not recorded     Body mass index is 25.33 kg/m.   GENERAL EXAM: Patient in no distress.  CARDIOVASCULAR: Regular rate and rhythm, no murmurs, no carotid bruits  NEUROLOGIC: MENTAL STATUS: awake, alert, language fluent, comprehension intact, naming intact CRANIAL NERVE:  pupils equal and reactive to light, visual fields full to confrontation, extraocular muscles intact, no nystagmus, facial  sensation --> SLIGHT HYPERSENSITIVE IN RIGHT V1; facial strength symmetric, uvula midline, shoulder shrug symmetric, tongue midline. POSITIVE MYERSON'S. MOTOR: Normal bulk and tone.  RARE RESTING TREMOR IN MOUTH AND LUE; MILD LEFT ARM RIGIDITY. BRADYKINESIA IN LUE AND LLE. Full strength in the BUE, BLE SENSORY: normal and symmetric to light touch COORDINATION: finger-nose-finger, fine finger movements normal REFLEXES: deep tendon reflexes TRACE; ABSENT AT ANKLES.  GAIT/STATION: UNSTEADY GAIT; USES A CANE; STOOPED POSTURE.    DIAGNOSTIC DATA (LABS, IMAGING, TESTING) - I reviewed patient records, labs, notes, testing and imaging myself where available.  Lab Results  Component Value Date   WBC 8.0 06/28/2011   HGB 13.7 06/28/2011   HCT 40.1 06/28/2011   MCV 87.6 06/28/2011   PLT 227 06/28/2011      Component Value Date/Time   NA 133 (L) 06/28/2011 1330   K 4.2 06/28/2011 1330   CL 96 06/28/2011 1330   CO2 28 06/28/2011 1330   GLUCOSE 110 (H) 06/28/2011 1330   BUN 9 06/28/2011 1330   CREATININE 0.62 06/28/2011 1330   CALCIUM 9.4 06/28/2011 1330   PROT 7.0 06/28/2011 1330   ALBUMIN 3.8 06/28/2011 1330   AST 22 06/28/2011 1330   ALT 17 06/28/2011 1330   ALKPHOS 64 06/28/2011 1330   BILITOT 0.4 06/28/2011 1330   GFRNONAA 81 (L) 06/28/2011 1330   GFRAA >90 06/28/2011 1330   No results found for: CHOL Lab Results  Component Value Date   HGBA1C  03/10/2008    5.9 (NOTE)   The ADA recommends the following therapeutic goal for glycemic   control related to Hgb A1C measurement:   Goal of Therapy:   < 7.0% Hgb A1C   Reference: American Diabetes Association: Clinical Practice   Recommendations 2008, Diabetes Care,  2008, 31:(Suppl 1).   No results found for: VITAMINB12 Lab Results  Component Value Date   TSH 2.131 *Test methodology is 3rd generation TSH* 03/10/2008    06/08/10 MRI brain  - chronic stable changes of microvascular ischemia and mild generalized atrophy. No significant  change compared with MRI 03/09/08.   ASSESSMENT AND PLAN  84 y.o. right-handed female with hypothyroidism, here with resting tremor, bradykinesia and postural instability.  Symptoms and exam are consistent with Parkinson's disease --> stable.  Right V1 shingles attack on 06/21/15 (herpes zoster ophthalmicus). Gradually improving.    Dx:  Parkinson's disease ([place])  Gait difficulty   PLAN:  - continue ropinirole 2mg  three times a day + carb/levo 25/100 1 tab three times a day  - continue physical therapy  - caution with balance; use cane / walker   Meds ordered this encounter  Medications  . rOPINIRole (REQUIP) 2 MG tablet    Sig: Take 1 tablet (2 mg total) by mouth 3 (three) times daily.    Dispense:  270 tablet    Refill:  4  . carbidopa-levodopa (SINEMET IR) 25-100 MG tablet    Sig: Take 1 tablet by mouth 3 (three) times daily.    Dispense:  270 tablet    Refill:  4   Return in about 1 year (around 04/03/2018).    Penni Bombard, MD 3/56/7014, 1:03 PM Certified in Neurology, Neurophysiology and Neuroimaging  [facility] [address] [place],  [address] [phone]

## 2017-04-03 DIAGNOSIS — R2681 Unsteadiness on feet: Secondary | ICD-10-CM | POA: Diagnosis not present

## 2017-04-03 DIAGNOSIS — R293 Abnormal posture: Secondary | ICD-10-CM | POA: Diagnosis not present

## 2017-04-03 DIAGNOSIS — Z9181 History of falling: Secondary | ICD-10-CM | POA: Diagnosis not present

## 2017-04-04 DIAGNOSIS — Z9181 History of falling: Secondary | ICD-10-CM | POA: Diagnosis not present

## 2017-04-04 DIAGNOSIS — R293 Abnormal posture: Secondary | ICD-10-CM | POA: Diagnosis not present

## 2017-04-04 DIAGNOSIS — R2681 Unsteadiness on feet: Secondary | ICD-10-CM | POA: Diagnosis not present

## 2017-04-07 DIAGNOSIS — R2681 Unsteadiness on feet: Secondary | ICD-10-CM | POA: Diagnosis not present

## 2017-04-07 DIAGNOSIS — R293 Abnormal posture: Secondary | ICD-10-CM | POA: Diagnosis not present

## 2017-04-07 DIAGNOSIS — Z9181 History of falling: Secondary | ICD-10-CM | POA: Diagnosis not present

## 2017-04-09 DIAGNOSIS — R2681 Unsteadiness on feet: Secondary | ICD-10-CM | POA: Diagnosis not present

## 2017-04-09 DIAGNOSIS — R293 Abnormal posture: Secondary | ICD-10-CM | POA: Diagnosis not present

## 2017-04-09 DIAGNOSIS — Z9181 History of falling: Secondary | ICD-10-CM | POA: Diagnosis not present

## 2017-04-10 DIAGNOSIS — R293 Abnormal posture: Secondary | ICD-10-CM | POA: Diagnosis not present

## 2017-04-10 DIAGNOSIS — Z9181 History of falling: Secondary | ICD-10-CM | POA: Diagnosis not present

## 2017-04-10 DIAGNOSIS — R2681 Unsteadiness on feet: Secondary | ICD-10-CM | POA: Diagnosis not present

## 2017-04-15 DIAGNOSIS — R2681 Unsteadiness on feet: Secondary | ICD-10-CM | POA: Diagnosis not present

## 2017-04-15 DIAGNOSIS — Z9181 History of falling: Secondary | ICD-10-CM | POA: Diagnosis not present

## 2017-04-15 DIAGNOSIS — R293 Abnormal posture: Secondary | ICD-10-CM | POA: Diagnosis not present

## 2017-04-17 DIAGNOSIS — Z9181 History of falling: Secondary | ICD-10-CM | POA: Diagnosis not present

## 2017-04-17 DIAGNOSIS — R2681 Unsteadiness on feet: Secondary | ICD-10-CM | POA: Diagnosis not present

## 2017-04-17 DIAGNOSIS — R293 Abnormal posture: Secondary | ICD-10-CM | POA: Diagnosis not present

## 2017-04-18 DIAGNOSIS — R2681 Unsteadiness on feet: Secondary | ICD-10-CM | POA: Diagnosis not present

## 2017-04-18 DIAGNOSIS — Z9181 History of falling: Secondary | ICD-10-CM | POA: Diagnosis not present

## 2017-04-18 DIAGNOSIS — R293 Abnormal posture: Secondary | ICD-10-CM | POA: Diagnosis not present

## 2017-04-21 DIAGNOSIS — R2681 Unsteadiness on feet: Secondary | ICD-10-CM | POA: Diagnosis not present

## 2017-04-21 DIAGNOSIS — Z9181 History of falling: Secondary | ICD-10-CM | POA: Diagnosis not present

## 2017-04-21 DIAGNOSIS — R293 Abnormal posture: Secondary | ICD-10-CM | POA: Diagnosis not present

## 2017-04-23 DIAGNOSIS — Z9181 History of falling: Secondary | ICD-10-CM | POA: Diagnosis not present

## 2017-04-23 DIAGNOSIS — R293 Abnormal posture: Secondary | ICD-10-CM | POA: Diagnosis not present

## 2017-04-23 DIAGNOSIS — R2681 Unsteadiness on feet: Secondary | ICD-10-CM | POA: Diagnosis not present

## 2017-04-24 DIAGNOSIS — R293 Abnormal posture: Secondary | ICD-10-CM | POA: Diagnosis not present

## 2017-04-24 DIAGNOSIS — Z9181 History of falling: Secondary | ICD-10-CM | POA: Diagnosis not present

## 2017-04-24 DIAGNOSIS — R2681 Unsteadiness on feet: Secondary | ICD-10-CM | POA: Diagnosis not present

## 2017-04-29 DIAGNOSIS — R293 Abnormal posture: Secondary | ICD-10-CM | POA: Diagnosis not present

## 2017-04-29 DIAGNOSIS — R2681 Unsteadiness on feet: Secondary | ICD-10-CM | POA: Diagnosis not present

## 2017-04-29 DIAGNOSIS — Z9181 History of falling: Secondary | ICD-10-CM | POA: Diagnosis not present

## 2017-05-02 DIAGNOSIS — Z9181 History of falling: Secondary | ICD-10-CM | POA: Diagnosis not present

## 2017-05-02 DIAGNOSIS — R2681 Unsteadiness on feet: Secondary | ICD-10-CM | POA: Diagnosis not present

## 2017-05-02 DIAGNOSIS — R293 Abnormal posture: Secondary | ICD-10-CM | POA: Diagnosis not present

## 2017-05-06 DIAGNOSIS — R293 Abnormal posture: Secondary | ICD-10-CM | POA: Diagnosis not present

## 2017-05-06 DIAGNOSIS — R7309 Other abnormal glucose: Secondary | ICD-10-CM | POA: Diagnosis not present

## 2017-05-06 DIAGNOSIS — R2681 Unsteadiness on feet: Secondary | ICD-10-CM | POA: Diagnosis not present

## 2017-05-06 DIAGNOSIS — Z9181 History of falling: Secondary | ICD-10-CM | POA: Diagnosis not present

## 2017-05-09 DIAGNOSIS — R293 Abnormal posture: Secondary | ICD-10-CM | POA: Diagnosis not present

## 2017-05-09 DIAGNOSIS — R2681 Unsteadiness on feet: Secondary | ICD-10-CM | POA: Diagnosis not present

## 2017-05-09 DIAGNOSIS — Z9181 History of falling: Secondary | ICD-10-CM | POA: Diagnosis not present

## 2017-05-13 DIAGNOSIS — Z9181 History of falling: Secondary | ICD-10-CM | POA: Diagnosis not present

## 2017-05-13 DIAGNOSIS — R2681 Unsteadiness on feet: Secondary | ICD-10-CM | POA: Diagnosis not present

## 2017-05-13 DIAGNOSIS — R293 Abnormal posture: Secondary | ICD-10-CM | POA: Diagnosis not present

## 2017-05-16 DIAGNOSIS — R293 Abnormal posture: Secondary | ICD-10-CM | POA: Diagnosis not present

## 2017-05-16 DIAGNOSIS — R2681 Unsteadiness on feet: Secondary | ICD-10-CM | POA: Diagnosis not present

## 2017-05-16 DIAGNOSIS — Z9181 History of falling: Secondary | ICD-10-CM | POA: Diagnosis not present

## 2017-06-09 DIAGNOSIS — Z5189 Encounter for other specified aftercare: Secondary | ICD-10-CM | POA: Diagnosis not present

## 2017-06-28 IMAGING — DX DG FINGER LITTLE 2+V*L*
3 series · 3 of 3 positions shown · non-contrast
Comparison: None.

CLINICAL DATA: 89-year-old female with fall and pain in the fifth
digit.

EXAM:
LEFT LITTLE FINGER 2+V

[finger ap]
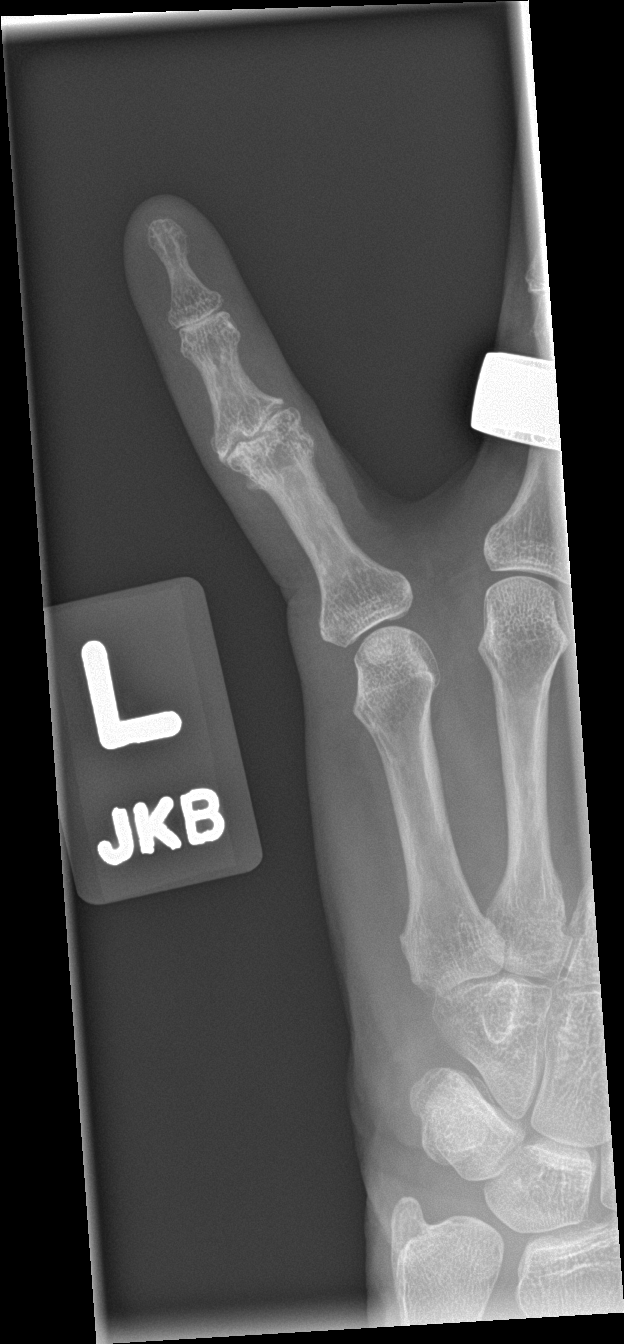

[finger obl]
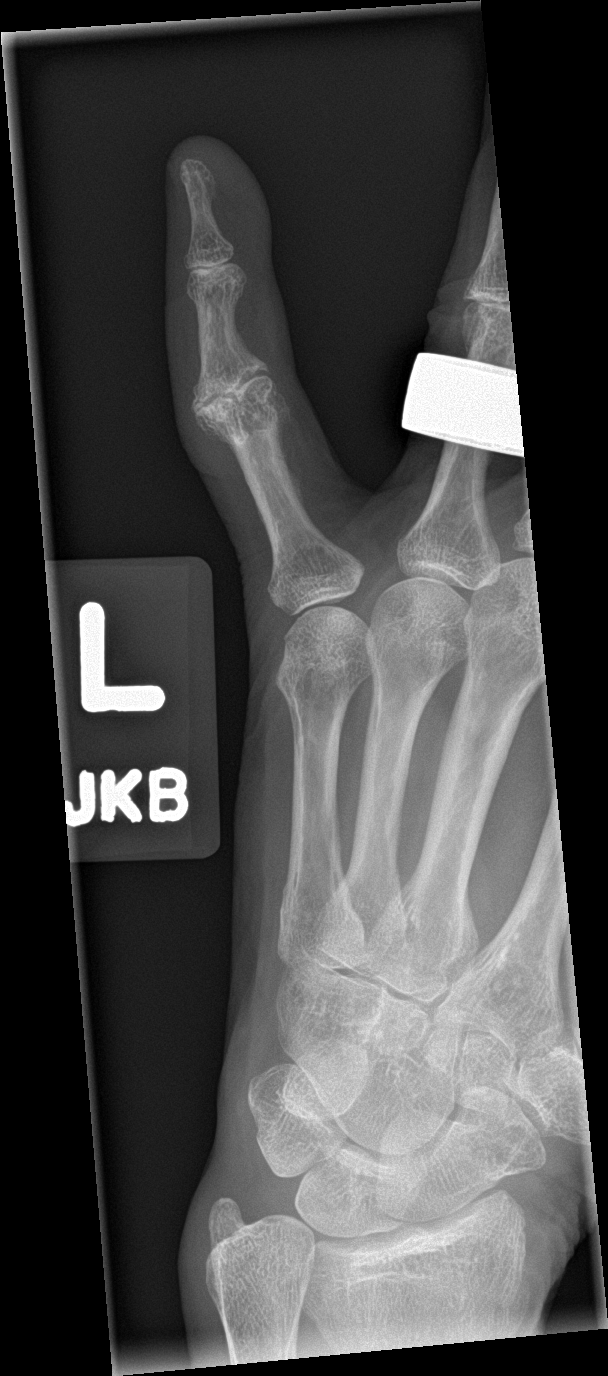

[finger lat]
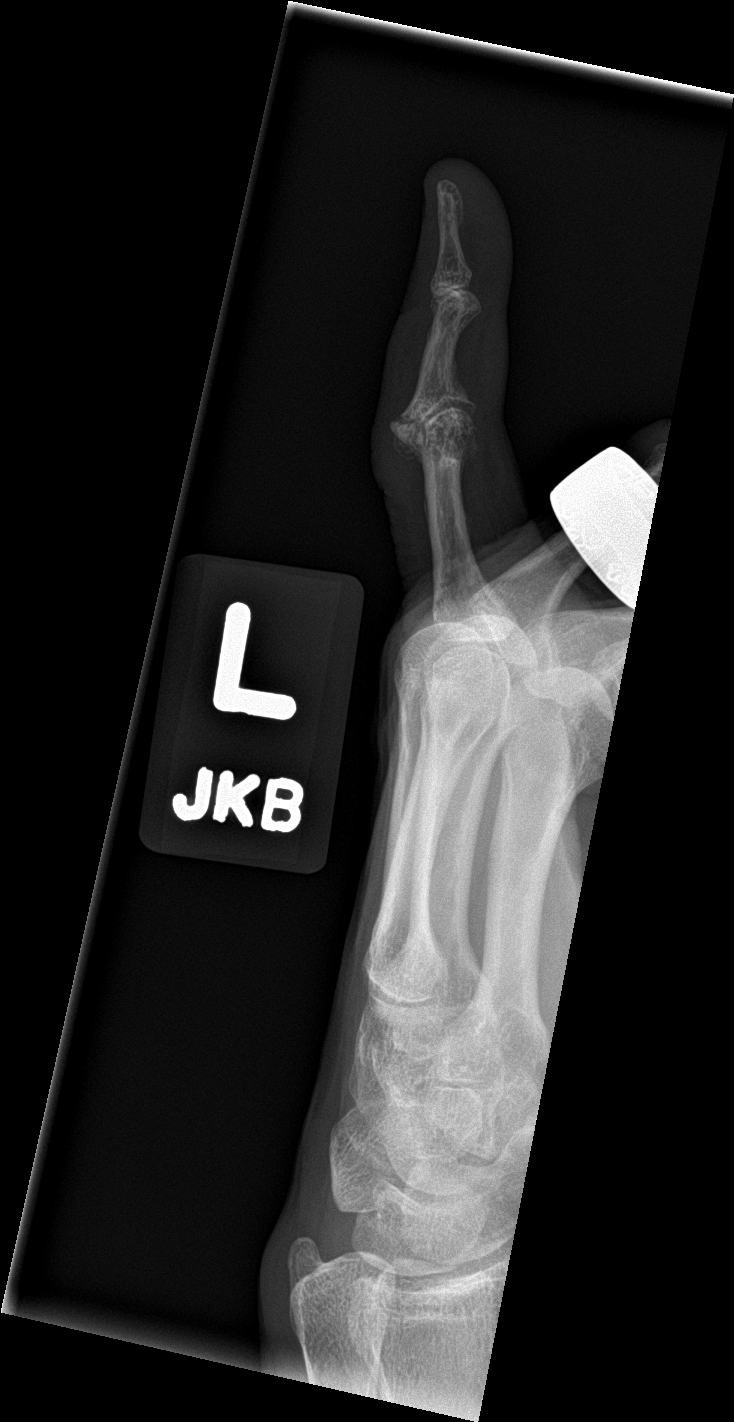

[3 of 3 positions shown; findings below may reference images not displayed]

FINDINGS: There is an oblique lucency extending from the distal portion of the
proximal phalanx of the fifth digit into the PIP joint. This is age
indeterminate but appears to have areas of corticated margins an is
felt to represent chronic changes, although an acute fracture is not
entirely excluded. There is severe erosive arthritic changes of the
PIP joint. There is narrowing of the DIP joint space. No
dislocation. The bones are osteopenic. There is soft tissue swelling
of the fifth digit. No radiopaque foreign object identified.
IMPRESSION: Severe arthritic changes of the PIP of the fifth digit. Oblique
lucency extending from the distal portion of the proximal phalanx of
the fifth digit into the PIP joint is age indeterminate and although
may be chronic, an acute fracture is not entirely excluded. No
dislocation.

Osteopenia.

## 2017-07-16 DIAGNOSIS — H5710 Ocular pain, unspecified eye: Secondary | ICD-10-CM | POA: Diagnosis not present

## 2017-07-17 DIAGNOSIS — H04123 Dry eye syndrome of bilateral lacrimal glands: Secondary | ICD-10-CM | POA: Diagnosis not present

## 2017-07-17 DIAGNOSIS — H02005 Unspecified entropion of left lower eyelid: Secondary | ICD-10-CM | POA: Diagnosis not present

## 2017-07-17 DIAGNOSIS — H10413 Chronic giant papillary conjunctivitis, bilateral: Secondary | ICD-10-CM | POA: Diagnosis not present

## 2017-08-01 DIAGNOSIS — H02004 Unspecified entropion of left upper eyelid: Secondary | ICD-10-CM | POA: Diagnosis not present

## 2017-08-01 DIAGNOSIS — H10413 Chronic giant papillary conjunctivitis, bilateral: Secondary | ICD-10-CM | POA: Diagnosis not present

## 2017-08-01 DIAGNOSIS — H02005 Unspecified entropion of left lower eyelid: Secondary | ICD-10-CM | POA: Diagnosis not present

## 2017-08-01 DIAGNOSIS — H04123 Dry eye syndrome of bilateral lacrimal glands: Secondary | ICD-10-CM | POA: Diagnosis not present

## 2017-08-01 DIAGNOSIS — Z961 Presence of intraocular lens: Secondary | ICD-10-CM | POA: Diagnosis not present

## 2017-09-01 DIAGNOSIS — R2681 Unsteadiness on feet: Secondary | ICD-10-CM | POA: Diagnosis not present

## 2017-09-01 DIAGNOSIS — R531 Weakness: Secondary | ICD-10-CM | POA: Diagnosis not present

## 2017-09-01 DIAGNOSIS — R2689 Other abnormalities of gait and mobility: Secondary | ICD-10-CM | POA: Diagnosis not present

## 2017-09-01 DIAGNOSIS — R279 Unspecified lack of coordination: Secondary | ICD-10-CM | POA: Diagnosis not present

## 2017-09-01 DIAGNOSIS — R319 Hematuria, unspecified: Secondary | ICD-10-CM | POA: Diagnosis not present

## 2017-09-01 DIAGNOSIS — B9620 Unspecified Escherichia coli [E. coli] as the cause of diseases classified elsewhere: Secondary | ICD-10-CM | POA: Diagnosis not present

## 2017-09-01 DIAGNOSIS — G20 Parkinson's disease: Secondary | ICD-10-CM | POA: Diagnosis not present

## 2017-09-01 DIAGNOSIS — R269 Unspecified abnormalities of gait and mobility: Secondary | ICD-10-CM | POA: Diagnosis not present

## 2017-09-01 DIAGNOSIS — R32 Unspecified urinary incontinence: Secondary | ICD-10-CM | POA: Diagnosis not present

## 2017-09-01 DIAGNOSIS — N390 Urinary tract infection, site not specified: Secondary | ICD-10-CM | POA: Diagnosis not present

## 2017-09-02 DIAGNOSIS — R269 Unspecified abnormalities of gait and mobility: Secondary | ICD-10-CM | POA: Diagnosis not present

## 2017-09-02 DIAGNOSIS — R279 Unspecified lack of coordination: Secondary | ICD-10-CM | POA: Diagnosis not present

## 2017-09-02 DIAGNOSIS — N390 Urinary tract infection, site not specified: Secondary | ICD-10-CM | POA: Diagnosis not present

## 2017-09-02 DIAGNOSIS — E871 Hypo-osmolality and hyponatremia: Secondary | ICD-10-CM | POA: Diagnosis not present

## 2017-09-02 DIAGNOSIS — G20 Parkinson's disease: Secondary | ICD-10-CM | POA: Diagnosis not present

## 2017-09-02 DIAGNOSIS — Z7982 Long term (current) use of aspirin: Secondary | ICD-10-CM | POA: Diagnosis not present

## 2017-09-02 DIAGNOSIS — M6281 Muscle weakness (generalized): Secondary | ICD-10-CM | POA: Diagnosis not present

## 2017-09-02 DIAGNOSIS — G609 Hereditary and idiopathic neuropathy, unspecified: Secondary | ICD-10-CM | POA: Diagnosis not present

## 2017-09-02 DIAGNOSIS — E639 Nutritional deficiency, unspecified: Secondary | ICD-10-CM | POA: Diagnosis not present

## 2017-09-02 DIAGNOSIS — E039 Hypothyroidism, unspecified: Secondary | ICD-10-CM | POA: Diagnosis not present

## 2017-09-02 DIAGNOSIS — B0229 Other postherpetic nervous system involvement: Secondary | ICD-10-CM | POA: Diagnosis not present

## 2017-09-02 DIAGNOSIS — B9620 Unspecified Escherichia coli [E. coli] as the cause of diseases classified elsewhere: Secondary | ICD-10-CM | POA: Diagnosis not present

## 2017-09-03 DIAGNOSIS — R279 Unspecified lack of coordination: Secondary | ICD-10-CM | POA: Diagnosis not present

## 2017-09-03 DIAGNOSIS — R269 Unspecified abnormalities of gait and mobility: Secondary | ICD-10-CM | POA: Diagnosis not present

## 2017-09-03 DIAGNOSIS — G20 Parkinson's disease: Secondary | ICD-10-CM | POA: Diagnosis not present

## 2017-09-03 DIAGNOSIS — B9620 Unspecified Escherichia coli [E. coli] as the cause of diseases classified elsewhere: Secondary | ICD-10-CM | POA: Diagnosis not present

## 2017-09-03 DIAGNOSIS — N390 Urinary tract infection, site not specified: Secondary | ICD-10-CM | POA: Diagnosis not present

## 2017-09-04 DIAGNOSIS — B9620 Unspecified Escherichia coli [E. coli] as the cause of diseases classified elsewhere: Secondary | ICD-10-CM | POA: Diagnosis not present

## 2017-09-04 DIAGNOSIS — B0229 Other postherpetic nervous system involvement: Secondary | ICD-10-CM | POA: Diagnosis not present

## 2017-09-04 DIAGNOSIS — N390 Urinary tract infection, site not specified: Secondary | ICD-10-CM | POA: Diagnosis not present

## 2017-09-04 DIAGNOSIS — R279 Unspecified lack of coordination: Secondary | ICD-10-CM | POA: Diagnosis not present

## 2017-09-04 DIAGNOSIS — R269 Unspecified abnormalities of gait and mobility: Secondary | ICD-10-CM | POA: Diagnosis not present

## 2017-09-04 DIAGNOSIS — M545 Low back pain: Secondary | ICD-10-CM | POA: Diagnosis not present

## 2017-09-04 DIAGNOSIS — G20 Parkinson's disease: Secondary | ICD-10-CM | POA: Diagnosis not present

## 2017-09-05 DIAGNOSIS — N390 Urinary tract infection, site not specified: Secondary | ICD-10-CM | POA: Diagnosis not present

## 2017-09-05 DIAGNOSIS — G20 Parkinson's disease: Secondary | ICD-10-CM | POA: Diagnosis not present

## 2017-09-05 DIAGNOSIS — B9620 Unspecified Escherichia coli [E. coli] as the cause of diseases classified elsewhere: Secondary | ICD-10-CM | POA: Diagnosis not present

## 2017-09-05 DIAGNOSIS — R279 Unspecified lack of coordination: Secondary | ICD-10-CM | POA: Diagnosis not present

## 2017-09-05 DIAGNOSIS — R269 Unspecified abnormalities of gait and mobility: Secondary | ICD-10-CM | POA: Diagnosis not present

## 2017-09-08 DIAGNOSIS — G20 Parkinson's disease: Secondary | ICD-10-CM | POA: Diagnosis not present

## 2017-09-08 DIAGNOSIS — R279 Unspecified lack of coordination: Secondary | ICD-10-CM | POA: Diagnosis not present

## 2017-09-08 DIAGNOSIS — R269 Unspecified abnormalities of gait and mobility: Secondary | ICD-10-CM | POA: Diagnosis not present

## 2017-09-08 DIAGNOSIS — B9620 Unspecified Escherichia coli [E. coli] as the cause of diseases classified elsewhere: Secondary | ICD-10-CM | POA: Diagnosis not present

## 2017-09-08 DIAGNOSIS — N390 Urinary tract infection, site not specified: Secondary | ICD-10-CM | POA: Diagnosis not present

## 2017-09-09 DIAGNOSIS — G20 Parkinson's disease: Secondary | ICD-10-CM | POA: Diagnosis not present

## 2017-09-09 DIAGNOSIS — R269 Unspecified abnormalities of gait and mobility: Secondary | ICD-10-CM | POA: Diagnosis not present

## 2017-09-09 DIAGNOSIS — R279 Unspecified lack of coordination: Secondary | ICD-10-CM | POA: Diagnosis not present

## 2017-09-09 DIAGNOSIS — E876 Hypokalemia: Secondary | ICD-10-CM | POA: Diagnosis not present

## 2017-09-09 DIAGNOSIS — N390 Urinary tract infection, site not specified: Secondary | ICD-10-CM | POA: Diagnosis not present

## 2017-09-09 DIAGNOSIS — B9620 Unspecified Escherichia coli [E. coli] as the cause of diseases classified elsewhere: Secondary | ICD-10-CM | POA: Diagnosis not present

## 2017-09-10 DIAGNOSIS — R269 Unspecified abnormalities of gait and mobility: Secondary | ICD-10-CM | POA: Diagnosis not present

## 2017-09-10 DIAGNOSIS — G20 Parkinson's disease: Secondary | ICD-10-CM | POA: Diagnosis not present

## 2017-09-10 DIAGNOSIS — N390 Urinary tract infection, site not specified: Secondary | ICD-10-CM | POA: Diagnosis not present

## 2017-09-10 DIAGNOSIS — B9620 Unspecified Escherichia coli [E. coli] as the cause of diseases classified elsewhere: Secondary | ICD-10-CM | POA: Diagnosis not present

## 2017-09-10 DIAGNOSIS — R279 Unspecified lack of coordination: Secondary | ICD-10-CM | POA: Diagnosis not present

## 2017-09-11 DIAGNOSIS — R269 Unspecified abnormalities of gait and mobility: Secondary | ICD-10-CM | POA: Diagnosis not present

## 2017-09-11 DIAGNOSIS — B9620 Unspecified Escherichia coli [E. coli] as the cause of diseases classified elsewhere: Secondary | ICD-10-CM | POA: Diagnosis not present

## 2017-09-11 DIAGNOSIS — G20 Parkinson's disease: Secondary | ICD-10-CM | POA: Diagnosis not present

## 2017-09-11 DIAGNOSIS — N390 Urinary tract infection, site not specified: Secondary | ICD-10-CM | POA: Diagnosis not present

## 2017-09-11 DIAGNOSIS — R279 Unspecified lack of coordination: Secondary | ICD-10-CM | POA: Diagnosis not present

## 2017-09-12 DIAGNOSIS — N390 Urinary tract infection, site not specified: Secondary | ICD-10-CM | POA: Diagnosis not present

## 2017-09-12 DIAGNOSIS — G20 Parkinson's disease: Secondary | ICD-10-CM | POA: Diagnosis not present

## 2017-09-12 DIAGNOSIS — R269 Unspecified abnormalities of gait and mobility: Secondary | ICD-10-CM | POA: Diagnosis not present

## 2017-09-12 DIAGNOSIS — R279 Unspecified lack of coordination: Secondary | ICD-10-CM | POA: Diagnosis not present

## 2017-09-12 DIAGNOSIS — B9620 Unspecified Escherichia coli [E. coli] as the cause of diseases classified elsewhere: Secondary | ICD-10-CM | POA: Diagnosis not present

## 2017-09-15 DIAGNOSIS — R279 Unspecified lack of coordination: Secondary | ICD-10-CM | POA: Diagnosis not present

## 2017-09-15 DIAGNOSIS — G20 Parkinson's disease: Secondary | ICD-10-CM | POA: Diagnosis not present

## 2017-09-15 DIAGNOSIS — N390 Urinary tract infection, site not specified: Secondary | ICD-10-CM | POA: Diagnosis not present

## 2017-09-15 DIAGNOSIS — B9620 Unspecified Escherichia coli [E. coli] as the cause of diseases classified elsewhere: Secondary | ICD-10-CM | POA: Diagnosis not present

## 2017-09-15 DIAGNOSIS — R269 Unspecified abnormalities of gait and mobility: Secondary | ICD-10-CM | POA: Diagnosis not present

## 2017-09-16 DIAGNOSIS — G20 Parkinson's disease: Secondary | ICD-10-CM | POA: Diagnosis not present

## 2017-09-16 DIAGNOSIS — B9620 Unspecified Escherichia coli [E. coli] as the cause of diseases classified elsewhere: Secondary | ICD-10-CM | POA: Diagnosis not present

## 2017-09-16 DIAGNOSIS — M1991 Primary osteoarthritis, unspecified site: Secondary | ICD-10-CM | POA: Diagnosis not present

## 2017-09-16 DIAGNOSIS — N390 Urinary tract infection, site not specified: Secondary | ICD-10-CM | POA: Diagnosis not present

## 2017-09-16 DIAGNOSIS — R279 Unspecified lack of coordination: Secondary | ICD-10-CM | POA: Diagnosis not present

## 2017-09-16 DIAGNOSIS — E039 Hypothyroidism, unspecified: Secondary | ICD-10-CM | POA: Diagnosis not present

## 2017-09-16 DIAGNOSIS — R54 Age-related physical debility: Secondary | ICD-10-CM | POA: Diagnosis not present

## 2017-09-16 DIAGNOSIS — R269 Unspecified abnormalities of gait and mobility: Secondary | ICD-10-CM | POA: Diagnosis not present

## 2017-09-17 DIAGNOSIS — G20 Parkinson's disease: Secondary | ICD-10-CM | POA: Diagnosis not present

## 2017-09-17 DIAGNOSIS — R279 Unspecified lack of coordination: Secondary | ICD-10-CM | POA: Diagnosis not present

## 2017-09-17 DIAGNOSIS — R269 Unspecified abnormalities of gait and mobility: Secondary | ICD-10-CM | POA: Diagnosis not present

## 2017-09-17 DIAGNOSIS — B9620 Unspecified Escherichia coli [E. coli] as the cause of diseases classified elsewhere: Secondary | ICD-10-CM | POA: Diagnosis not present

## 2017-09-17 DIAGNOSIS — N390 Urinary tract infection, site not specified: Secondary | ICD-10-CM | POA: Diagnosis not present

## 2017-09-18 DIAGNOSIS — N390 Urinary tract infection, site not specified: Secondary | ICD-10-CM | POA: Diagnosis not present

## 2017-09-18 DIAGNOSIS — B9620 Unspecified Escherichia coli [E. coli] as the cause of diseases classified elsewhere: Secondary | ICD-10-CM | POA: Diagnosis not present

## 2017-09-18 DIAGNOSIS — R269 Unspecified abnormalities of gait and mobility: Secondary | ICD-10-CM | POA: Diagnosis not present

## 2017-09-18 DIAGNOSIS — R279 Unspecified lack of coordination: Secondary | ICD-10-CM | POA: Diagnosis not present

## 2017-09-18 DIAGNOSIS — G20 Parkinson's disease: Secondary | ICD-10-CM | POA: Diagnosis not present

## 2017-10-18 ENCOUNTER — Emergency Department (HOSPITAL_BASED_OUTPATIENT_CLINIC_OR_DEPARTMENT_OTHER)
Admission: EM | Admit: 2017-10-18 | Discharge: 2017-10-18 | Disposition: A | Discharge status: Home / Self Care | Payer: Medicare Other | Attending: Emergency Medicine | Admitting: Emergency Medicine

## 2017-10-18 ENCOUNTER — Encounter (HOSPITAL_BASED_OUTPATIENT_CLINIC_OR_DEPARTMENT_OTHER): Payer: Self-pay | Admitting: *Deleted

## 2017-10-18 ENCOUNTER — Other Ambulatory Visit: Payer: Self-pay

## 2017-10-18 ENCOUNTER — Emergency Department (HOSPITAL_BASED_OUTPATIENT_CLINIC_OR_DEPARTMENT_OTHER): Payer: Medicare Other

## 2017-10-18 DIAGNOSIS — E039 Hypothyroidism, unspecified: Secondary | ICD-10-CM | POA: Insufficient documentation

## 2017-10-18 DIAGNOSIS — I959 Hypotension, unspecified: Secondary | ICD-10-CM | POA: Diagnosis not present

## 2017-10-18 DIAGNOSIS — G20 Parkinson's disease: Secondary | ICD-10-CM | POA: Insufficient documentation

## 2017-10-18 DIAGNOSIS — Z7982 Long term (current) use of aspirin: Secondary | ICD-10-CM | POA: Insufficient documentation

## 2017-10-18 DIAGNOSIS — R197 Diarrhea, unspecified: Secondary | ICD-10-CM | POA: Diagnosis not present

## 2017-10-18 DIAGNOSIS — R112 Nausea with vomiting, unspecified: Secondary | ICD-10-CM | POA: Diagnosis not present

## 2017-10-18 LAB — CBC WITH DIFFERENTIAL/PLATELET
ABS IMMATURE GRANULOCYTES: 0.02 10*3/uL (ref 0.00–0.07)
BASOS ABS: 0 10*3/uL (ref 0.0–0.1)
BASOS PCT: 1 %
EOS ABS: 0.1 10*3/uL (ref 0.0–0.5)
Eosinophils Relative: 1 %
HCT: 42.3 % (ref 36.0–46.0)
HEMOGLOBIN: 13.5 g/dL (ref 12.0–15.0)
IMMATURE GRANULOCYTES: 0 %
LYMPHS ABS: 2.5 10*3/uL (ref 0.7–4.0)
Lymphocytes Relative: 28 %
MCH: 29.5 pg (ref 26.0–34.0)
MCHC: 31.9 g/dL (ref 30.0–36.0)
MCV: 92.6 fL (ref 80.0–100.0)
Monocytes Absolute: 0.7 10*3/uL (ref 0.1–1.0)
Monocytes Relative: 8 %
NEUTROS PCT: 62 %
NRBC: 0 % (ref 0.0–0.2)
Neutro Abs: 5.5 10*3/uL (ref 1.7–7.7)
Platelets: 198 10*3/uL (ref 150–400)
RBC: 4.57 MIL/uL (ref 3.87–5.11)
RDW: 14 % (ref 11.5–15.5)
WBC: 8.8 10*3/uL (ref 4.0–10.5)

## 2017-10-18 LAB — URINALYSIS, ROUTINE W REFLEX MICROSCOPIC
Bilirubin Urine: NEGATIVE
Glucose, UA: NEGATIVE mg/dL
Hgb urine dipstick: NEGATIVE
KETONES UR: NEGATIVE mg/dL
Leukocytes, UA: NEGATIVE
NITRITE: NEGATIVE
PH: 7 (ref 5.0–8.0)
Protein, ur: NEGATIVE mg/dL
Specific Gravity, Urine: 1.015 (ref 1.005–1.030)

## 2017-10-18 LAB — COMPREHENSIVE METABOLIC PANEL
ALK PHOS: 63 U/L (ref 38–126)
ALT: 16 U/L (ref 0–44)
ANION GAP: 11 (ref 5–15)
AST: 26 U/L (ref 15–41)
Albumin: 3.8 g/dL (ref 3.5–5.0)
BILIRUBIN TOTAL: 0.5 mg/dL (ref 0.3–1.2)
BUN: 15 mg/dL (ref 8–23)
CALCIUM: 8.8 mg/dL — AB (ref 8.9–10.3)
CO2: 25 mmol/L (ref 22–32)
CREATININE: 0.62 mg/dL (ref 0.44–1.00)
Chloride: 98 mmol/L (ref 98–111)
GFR calc Af Amer: >60 mL/min (ref >60)
Glucose, Bld: 116 mg/dL — ABNORMAL HIGH (ref 70–99)
Potassium: 4 mmol/L (ref 3.5–5.1)
Sodium: 134 mmol/L — ABNORMAL LOW (ref 135–145)
TOTAL PROTEIN: 6.9 g/dL (ref 6.5–8.1)

## 2017-10-18 LAB — LIPASE, BLOOD: LIPASE: 25 U/L (ref 11–51)

## 2017-10-18 MED ORDER — ONDANSETRON 4 MG PO TBDP
4.0000 mg | ORAL_TABLET | Freq: Once | ORAL | Status: AC
  Administered 2017-10-18: 4 mg via ORAL
  Filled 2017-10-18: qty 1

## 2017-10-18 MED ORDER — ONDANSETRON HCL 4 MG PO TABS: 4.0000 mg | ORAL_TABLET | Freq: Four times a day (QID) | ORAL | 0 refills | Status: DC

## 2017-10-18 NOTE — ED Notes (Signed)
Provided crackers and gingerale to pt. Dr Johnney Killian requested.

## 2017-10-18 NOTE — ED Notes (Signed)
PT states understanding of care given, follow up care, and medication prescribed.  

## 2017-10-18 NOTE — ED Notes (Signed)
Patient transported to X-ray 

## 2017-10-18 NOTE — ED Provider Notes (Signed)
[facility] EMERGENCY DEPARTMENT Provider Note   CSN: [id number] Arrival date & time: 10/18/17  1115     History   Chief Complaint Chief Complaint  Patient presents with  . Emesis    HPI Courtney Reed is a 84 y.o. female.  HPI  Pt is an 84 year old female with a history of gallstones s/p cholecystectomy, appendectomy, hypothyroidism, nausea, Parkinson's disease, shingles, thyroid disease who presents the emergency department today complaining of nausea and vomiting that began yesterday.  Patient states his symptoms began after eating meat loaf.  Denies any hematemesis.  She denies any abdominal pain.  States that she has a history of having issues with constipation however she has been eating prunes and has not been constipated recently.  Her last bowel movement was yesterday and she states it was large.  No blood in her stool.  States she has not passed gas today.  Denies dysuria, frequency or urgency.  No chest pain or shortness of breath.  Past Medical History:  Diagnosis Date  . Gallstones   . Hypothyroidism   . Nausea   . Parkinson disease ([place])   . Shingles 06/21/15  . Thyroid disease     Patient Active Problem List   Diagnosis Date Noted  . Parkinson's disease ([place]) 03/24/2013  . Chest discomfort 11/18/2011  . Dyspnea 11/18/2011    Past Surgical History:  Procedure Laterality Date  . APPENDECTOMY  1950  . CHOLECYSTECTOMY  07/01/2011   Procedure: LAPAROSCOPIC CHOLECYSTECTOMY WITH INTRAOPERATIVE CHOLANGIOGRAM;  Surgeon: Marcello Moores A. Cornett, MD;  Location: [facility];  Service: General;  Laterality: N/A;  . EYE SURGERY     both cataracts  . hysterecomy - unknown type    . THYROIDECTOMY       OB History   None      Home Medications    Prior to Admission medications   Medication Sig Start Date End Date Taking? Authorizing Provider  aspirin 81 MG tablet 2 tabs daily prn   Yes [provider]  Calcium-Magnesium-Zinc  (CALCIUM-MAGNESUIUM-ZINC PO) Take 1 tablet by mouth daily. Reported on 07/14/2015   Yes [provider]  carbidopa-levodopa (SINEMET IR) 25-100 MG tablet Take 1 tablet by mouth 3 (three) times daily. 04/02/17  Yes Penumalli, Earlean Polka, MD  estradiol (ESTRACE) 0.5 MG tablet Take 0.5 mg by mouth daily.   Yes [provider]  levothyroxine (SYNTHROID, LEVOTHROID) 75 MCG tablet Take 75 mcg by mouth daily.   Yes [provider]  Multiple Vitamins-Minerals (HAIR/SKIN/NAILS PO) Take by mouth. Reported on 07/14/2015   Yes [provider]  Multiple Vitamins-Minerals (MULTIVITAMIN ADULT PO) Take by mouth. Hand, skin and nail once daily   Yes [provider]  omeprazole (PRILOSEC) 20 MG capsule 20 mg daily. 03/25/16  Yes [provider]  rOPINIRole (REQUIP) 2 MG tablet Take 1 tablet (2 mg total) by mouth 3 (three) times daily. 04/02/17  Yes Penumalli, Earlean Polka, MD  ondansetron (ZOFRAN) 4 MG tablet Take 1 tablet (4 mg total) by mouth every 6 (six) hours. 10/18/17   Jeneva Schweizer S, PA-C    Family History Family History  Problem Relation Age of Onset  . Heart disease Father        Heart attack  . Heart attack Unknown   . Cancer Unknown   . Coronary artery disease Unknown     Social History Social History   Tobacco Use  . Smoking status: Never Smoker  . Smokeless tobacco: Never Used  Substance Use Topics  . Alcohol use: No  . Drug use: No     Allergies   Sulfa antibiotics; Sulfacetamide sodium; and Sulfasalazine   Review of Systems Review of Systems  Constitutional: Negative for chills and fever.  HENT: Negative for sore throat.   Eyes: Negative for visual disturbance.  Respiratory: Negative for cough and shortness of breath.   Cardiovascular: Negative for chest pain.  Gastrointestinal: Positive for diarrhea and vomiting. Negative for abdominal pain, blood in stool and constipation.  Genitourinary: Negative for dysuria, frequency,  hematuria and urgency.  Musculoskeletal: Negative for back pain.  Skin: Negative for rash.  Neurological: Negative for headaches.     Physical Exam Updated Vital Signs BP (!) 136/56   Pulse 68   Temp 97.6 F (36.4 C) (Oral)   Resp 16   Ht 5\' 2"  (1.575 m)   Wt 62.8 kg   SpO2 92%   BMI 25.32 kg/m   Physical Exam  Constitutional: She appears well-developed and well-nourished. No distress.  HENT:  Head: Normocephalic and atraumatic.  Eyes: Conjunctivae are normal.  Neck: Neck supple.  Cardiovascular: Normal rate, regular rhythm and normal heart sounds.  No murmur heard. Pulmonary/Chest: Effort normal and breath sounds normal. No stridor. No respiratory distress. She has no wheezes.  Abdominal: Soft.  BS present. Very mild TTP to RLQ. No rebound TTP, guarding, or rigidity. No CVA TTP.  Musculoskeletal: She exhibits no edema.  Neurological: She is alert.  Skin: Skin is warm and dry. Capillary refill takes less than 2 seconds.  Psychiatric: She has a normal mood and affect.  Nursing note and vitals reviewed.   ED Treatments / Results  Labs (all labs ordered are listed, but only abnormal results are displayed) Labs Reviewed  URINALYSIS, ROUTINE W REFLEX MICROSCOPIC - Abnormal; Notable for the following components:      Result Value   APPearance HAZY (*)    All other components within normal limits  COMPREHENSIVE METABOLIC PANEL - Abnormal; Notable for the following components:   Sodium 134 (*)    Glucose, Bld 116 (*)    Calcium 8.8 (*)    All other components within normal limits  CBC WITH DIFFERENTIAL/PLATELET  LIPASE, BLOOD    EKG None  Radiology Dg Abdomen Acute W/chest  Result Date: 10/18/2017 CLINICAL DATA:  Nausea and vomiting after eating the left yesterday. EXAM: DG ABDOMEN ACUTE W/ 1V CHEST COMPARISON:  Chest x-ray dated June 28, 2011. FINDINGS: The heart size and mediastinal contours are within normal limits. Normal pulmonary vascularity.  Atherosclerotic calcification of the aortic arch. No focal consolidation, pleural effusion, or pneumothorax. There is no evidence of dilated bowel loops or free intraperitoneal air. No radiopaque calculi or other significant radiographic abnormality is seen. Prior cholecystectomy. No acute osseous abnormality. IMPRESSION: Negative abdominal radiographs.  No acute cardiopulmonary disease. Electronically Signed   By: Titus Dubin M.D.   On: 10/18/2017 13:39    Procedures Procedures (including critical care time)  Medications Ordered in ED Medications  ondansetron (ZOFRAN-ODT) disintegrating tablet 4 mg (4 mg Oral Given 10/18/17 1254)     Initial Impression / Assessment and Plan / ED Course  I have reviewed the triage vital signs and the nursing notes.  Pertinent labs & imaging results that were available during my care of the patient were reviewed by me and considered in my medical decision making (see chart for details).  Discussed pt presentation and exam findings with Dr Johnney Killian, who evaluated the pt and agrees with  the plan for discharge with outpt f/u.    Final Clinical Impressions(s) / ED Diagnoses   Final diagnoses:  Non-intractable vomiting with nausea, unspecified vomiting type   Patient presenting with nausea and vomiting that began yesterday.  Had several episodes yesterday and one episode today.  She denies any abdominal pain diarrhea or urinary symptoms.  No fever chills at home.  Symptoms started after eating meatloaf yesterday and she attributes symptoms to this.  Patient has a nonsurgical abdomen.  She had very mild right lower quadrant tenderness without rebound, guarding or rigidity.  No CVA tenderness bilaterally.  She had bowel sounds present.  Her lab work is very reassuring.  Her CBC is without leukocytosis or anemia.  Her CMP shows mild hyponatremia and mildly low calcium at 8.8.  Lipase is negative.  UA is without leukocytes or nitrites.  Vital signs have  remained reassuring.  She was given antiemetics in the ED and was able to tolerate p.o.  She is very eager to go home.  Do not suspect acute intra-abdominal pathology at this time that would require further work-up or admission to the hospital.  Suspect patient has viral gastroenteritis.  Have given her strict return precautions for any new or worsening symptoms in the meantime.  She voices understanding of plan reasons to return.  All questions answered.  ED Discharge Orders         Ordered    ondansetron (ZOFRAN) 4 MG tablet  Every 6 hours     10/18/17 1533           Rodney Booze, PA-C 10/18/17 1535    Charlesetta Shanks, MD 10/18/17 1733

## 2017-10-18 NOTE — ED Notes (Signed)
Pt states she vomited a small amount after lunch on Friday 10/11. She was given "medicine to make me feel better" and slept well. SHe did not eat dinner. This morning she ate breakfast and felt nauseated after eating and vomited a small amt. this morning. Pt states she has been "slightly constipated and has been eating prunes

## 2017-10-18 NOTE — ED Notes (Signed)
Ambulated to BR with assist of one

## 2017-10-18 NOTE — ED Notes (Signed)
Attempted to call [facility] and son to give update on patient and try to get patient back to [facility]. No answer by both.

## 2017-10-18 NOTE — ED Notes (Signed)
Transport called via [employer] @ [phone]

## 2017-10-18 NOTE — ED Provider Notes (Signed)
Medical screening examination/treatment/procedure(s) were conducted as a shared visit with non-physician practitioner(s) and myself.  I personally evaluated the patient during the encounter.  None Reports he has some meatloaf for dinner last night.  She then started to develop some nausea.  She went on to have an episode of vomiting.  She reports this morning she was able to eat her breakfast but still had some ongoing nausea.  Reports that the medical staff checked her and determined that she had to be seen at the hospital.  She denies she is having any pain.  She denies she is having any ongoing nausea.  She reports she feels back to normal.  She thinks it was due to the meatloaf.  Patient is alert and nontoxic.  She shows no signs of distress.  Heart is regular no gross rub murmur gallop.  Lungs grossly clear.  Patient's abdomen is soft and nontender.  I agree with plan of management.   Charlesetta Shanks, MD 10/18/17 [phone]

## 2017-10-18 NOTE — ED Triage Notes (Signed)
From [facility] with nausea and vomiting since eating meat loaf at dinner last night. She vomited after breakfast this am. Pt did not want to come to the ED but staff made her come. Pt c.o of no pain.

## 2017-10-18 NOTE — Discharge Instructions (Signed)
Please follow up with your primary doctor within the next 5-7 days.  Please return to the ER sooner if you have any new or worsening symptoms, or if you have any of the following symptoms: ° °Abdominal pain that does not go away.  °You have a fever.  °You keep throwing up (vomiting).  °The pain is felt only in portions of the abdomen. Pain in the right side could possibly be appendicitis. In an adult, pain in the left lower portion of the abdomen could be colitis or diverticulitis.  °You pass bloody or black tarry stools.  °There is bright red blood in the stool.  °The constipation stays for more than 4 days.  °There is belly (abdominal) or rectal pain.  °You do not seem to be getting better.  °You have any questions or concerns.  ° °

## 2017-10-18 NOTE — ED Notes (Signed)
Patient is resting comfortably. 

## 2017-10-18 NOTE — ED Notes (Signed)
ED Provider at bedside. 

## 2017-11-06 DIAGNOSIS — R54 Age-related physical debility: Secondary | ICD-10-CM | POA: Diagnosis not present

## 2017-11-06 DIAGNOSIS — G20 Parkinson's disease: Secondary | ICD-10-CM | POA: Diagnosis not present

## 2017-11-06 DIAGNOSIS — K219 Gastro-esophageal reflux disease without esophagitis: Secondary | ICD-10-CM | POA: Diagnosis not present

## 2017-11-06 DIAGNOSIS — E039 Hypothyroidism, unspecified: Secondary | ICD-10-CM | POA: Diagnosis not present

## 2017-11-27 DIAGNOSIS — H04129 Dry eye syndrome of unspecified lacrimal gland: Secondary | ICD-10-CM | POA: Diagnosis not present

## 2017-11-27 DIAGNOSIS — R21 Rash and other nonspecific skin eruption: Secondary | ICD-10-CM | POA: Diagnosis not present

## 2018-04-02 ENCOUNTER — Telehealth: Payer: Self-pay | Admitting: *Deleted

## 2018-04-02 NOTE — Telephone Encounter (Signed)
Called patient to advise her that due to current COVID 19 pandemic, our office is severely reducing in person visits in order to minimize the risk to our patients and healthcare providers. We recommend to convert her appointment to a video or phone visit. She is currently in assisted living at [facility]. She wasn't sure of who was calling her at first but seemed to understand after explaining a couple times. She asked me to call her daughter Chauncey Fischer. Called daughter, LVM requesting call back to discuss her mother's office visit next Monday.

## 2018-04-02 NOTE — Telephone Encounter (Signed)
Received call back from daughter, Marge Duncans who stated her mother was transitioned to [facility] after a UTI and subsequent decline in health. She advised I call Lillia Carmel RN to discuss medication refills. Deatra stated she will call back in 2 months to reschedule patient' s follow up on a date she can come as well. She verbalized understanding, appreciation of call. Called Chris, LVM requesting she call back.

## 2018-04-06 ENCOUNTER — Ambulatory Visit: Payer: Medicare Other | Admitting: Diagnostic Neuroimaging

## 2018-04-20 ENCOUNTER — Other Ambulatory Visit: Payer: Self-pay | Admitting: Diagnostic Neuroimaging

## 2018-04-20 ENCOUNTER — Telehealth: Payer: Self-pay | Admitting: *Deleted

## 2018-04-20 MED ORDER — CARBIDOPA-LEVODOPA 25-100 MG PO TABS: 1.0000 | ORAL_TABLET | Freq: Three times a day (TID) | ORAL | 4 refills | Status: DC

## 2018-04-20 NOTE — Telephone Encounter (Signed)
Received fax from [facility] re: refill needed on carbidopa- levodopa. Last refilled March 2019. Refilled per last Rx.

## 2018-08-28 ENCOUNTER — Emergency Department (HOSPITAL_BASED_OUTPATIENT_CLINIC_OR_DEPARTMENT_OTHER)
Admission: EM | Admit: 2018-08-28 | Discharge: 2018-08-28 | Disposition: A | Discharge status: Home / Self Care | Payer: Medicare Other | Attending: Emergency Medicine | Admitting: Emergency Medicine

## 2018-08-28 ENCOUNTER — Encounter (HOSPITAL_BASED_OUTPATIENT_CLINIC_OR_DEPARTMENT_OTHER): Payer: Self-pay | Admitting: Emergency Medicine

## 2018-08-28 ENCOUNTER — Emergency Department (HOSPITAL_BASED_OUTPATIENT_CLINIC_OR_DEPARTMENT_OTHER): Payer: Medicare Other

## 2018-08-28 ENCOUNTER — Other Ambulatory Visit: Payer: Self-pay

## 2018-08-28 DIAGNOSIS — G20 Parkinson's disease: Secondary | ICD-10-CM | POA: Insufficient documentation

## 2018-08-28 DIAGNOSIS — Y998 Other external cause status: Secondary | ICD-10-CM | POA: Insufficient documentation

## 2018-08-28 DIAGNOSIS — W01198A Fall on same level from slipping, tripping and stumbling with subsequent striking against other object, initial encounter: Secondary | ICD-10-CM | POA: Diagnosis not present

## 2018-08-28 DIAGNOSIS — S0003XA Contusion of scalp, initial encounter: Secondary | ICD-10-CM

## 2018-08-28 DIAGNOSIS — Z23 Encounter for immunization: Secondary | ICD-10-CM | POA: Insufficient documentation

## 2018-08-28 DIAGNOSIS — Y92012 Bathroom of single-family (private) house as the place of occurrence of the external cause: Secondary | ICD-10-CM | POA: Diagnosis not present

## 2018-08-28 DIAGNOSIS — S51811A Laceration without foreign body of right forearm, initial encounter: Secondary | ICD-10-CM

## 2018-08-28 DIAGNOSIS — Z7982 Long term (current) use of aspirin: Secondary | ICD-10-CM | POA: Insufficient documentation

## 2018-08-28 DIAGNOSIS — Z79899 Other long term (current) drug therapy: Secondary | ICD-10-CM | POA: Insufficient documentation

## 2018-08-28 DIAGNOSIS — E039 Hypothyroidism, unspecified: Secondary | ICD-10-CM | POA: Diagnosis not present

## 2018-08-28 DIAGNOSIS — S0990XA Unspecified injury of head, initial encounter: Secondary | ICD-10-CM | POA: Diagnosis present

## 2018-08-28 DIAGNOSIS — W19XXXA Unspecified fall, initial encounter: Secondary | ICD-10-CM

## 2018-08-28 DIAGNOSIS — Y9389 Activity, other specified: Secondary | ICD-10-CM | POA: Insufficient documentation

## 2018-08-28 HISTORY — DX: Obsessive-compulsive disorder, unspecified: F42.9

## 2018-08-28 MED ORDER — TETANUS-DIPHTH-ACELL PERTUSSIS 5-2.5-18.5 LF-MCG/0.5 IM SUSP
0.5000 mL | Freq: Once | INTRAMUSCULAR | Status: AC
  Administered 2018-08-28: 06:00:00 0.5 mL via INTRAMUSCULAR
  Filled 2018-08-28: qty 0.5

## 2018-08-28 NOTE — ED Notes (Signed)
Contacted [facility] for transport @ [phone]

## 2018-08-28 NOTE — ED Notes (Signed)
[facility] called for transport.

## 2018-08-28 NOTE — ED Notes (Signed)
Report attempted x3 to facility with no answer x 2 and 1 disconnection.

## 2018-08-28 NOTE — ED Provider Notes (Signed)
[facility] DEPT [facility] Provider Note: Georgena Spurling, MD, FACEP  CSN: [id number] MRN: [id number] ARRIVAL: 08/28/18 at [place]: [place]  08/28/18 5:58 AM Courtney Reed is a 85 y.o. female who was getting up to go to the bathroom this morning with her walker.  She lost her balance and fell in the bathroom striking the back of her head and her right forearm.  She has a skin tear to her right forearm which was Steri-Stripped by her living facility staff prior to arrival.  She did not lose consciousness.  She rates her head pain is a 4 out of 10, worse with palpation.  She has not been vomiting.    Past Medical History:  Diagnosis Date  . Gallstones   . Hypothyroidism   . Nausea   . Obsessive compulsive disorder   . Parkinson disease ([place])   . Shingles 06/21/15  . Thyroid disease     Past Surgical History:  Procedure Laterality Date  . APPENDECTOMY  1950  . CHOLECYSTECTOMY  07/01/2011   Procedure: LAPAROSCOPIC CHOLECYSTECTOMY WITH INTRAOPERATIVE CHOLANGIOGRAM;  Surgeon: Marcello Moores A. Cornett, MD;  Location: [facility];  Service: General;  Laterality: N/A;  . EYE SURGERY     both cataracts  . hysterecomy - unknown type    . THYROIDECTOMY      Family History  Problem Relation Age of Onset  . Heart disease Father        Heart attack  . Heart attack Other   . Cancer Other   . Coronary artery disease Other     Social History   Tobacco Use  . Smoking status: Never Smoker  . Smokeless tobacco: Never Used  Substance Use Topics  . Alcohol use: No  . Drug use: No    Prior to Admission medications   Medication Sig Start Date End Date Taking? Authorizing Provider  acetaminophen (TYLENOL) 500 MG tablet Take 500 mg by mouth every 6 (six) hours as needed.   Yes [provider]  aspirin EC 81 MG tablet Take 81 mg by mouth daily.   Yes [provider]  bisacodyl (BISACODYL) 5 MG EC tablet  Take 5 mg by mouth daily as needed for moderate constipation.   Yes [provider]  carbidopa-levodopa (SINEMET IR) 25-100 MG tablet Take 1 tablet by mouth 3 (three) times daily.   Yes [provider]  carboxymethylcellulose (REFRESH PLUS) 0.5 % SOLN 2 drops 2 (two) times daily as needed.   Yes [provider]  estradiol (ESTRACE) 0.5 MG tablet Take 0.5 mg by mouth daily.   Yes [provider]  levothyroxine (SYNTHROID) 75 MCG tablet Take 75 mcg by mouth daily before breakfast.   Yes [provider]  omeprazole (PRILOSEC OTC) 20 MG tablet Take 20 mg by mouth daily.   Yes [provider]  rOPINIRole (REQUIP) 2 MG tablet Take 2 mg by mouth 3 (three) times daily.   Yes [provider]  Vitamin D, Ergocalciferol, (DRISDOL) 1.25 MG (50000 UT) CAPS capsule Take 50,000 Units by mouth every 7 (seven) days.   Yes [provider]    Allergies Sulfa antibiotics, Sulfacetamide sodium, and Sulfasalazine   REVIEW OF SYSTEMS  Negative except as noted here or in the History of Present Illness.   PHYSICAL EXAMINATION  Initial Vital Signs Blood pressure (!) 188/85, pulse 66, temperature 98 F (36.7 C), temperature source Oral, resp. rate  16, height 5' 3.5" (1.613 m), weight 53.5 kg, SpO2 100 %.  Examination General: Well-developed, well-nourished female in no acute distress; appearance consistent with age of record HENT: normocephalic; small hematoma to right occiput; TMs normal Eyes: pupils equal, round and reactive to light; extraocular muscles intact Neck: supple; nontender; no pain on passive range of motion Heart: regular rate and rhythm Lungs: clear to auscultation bilaterally Abdomen: soft; nondistended; nontender; bowel sounds present Extremities: No deformity; full range of motion; pulses normal; trace edema of lower legs Neurologic: Awake, alert and oriented; motor function intact in all extremities and symmetric; no  facial droop Skin: Warm and dry; skin tear of right forearm with Steri-Strips in place Psychiatric: Normal mood and affect   RESULTS  Summary of this visit's results, reviewed by myself:   EKG Interpretation  Date/Time:    Ventricular Rate:    PR Interval:    QRS Duration:   QT Interval:    QTC Calculation:   R Axis:     Text Interpretation:        Laboratory Studies: No results found for this or any previous visit (from the past 24 hour(s)). Imaging Studies: Ct Head Wo Contrast  Result Date: 08/28/2018 CLINICAL DATA:  Posttraumatic headache after fall. EXAM: CT HEAD WITHOUT CONTRAST TECHNIQUE: Contiguous axial images were obtained from the base of the skull through the vertex without intravenous contrast. COMPARISON:  CT scan of March 10, 2017. FINDINGS: Brain: Mild chronic ischemic white matter disease is noted. Mild diffuse cortical atrophy is noted. No mass effect or midline shift is noted. Ventricular size is within normal limits. There is no evidence of mass lesion, hemorrhage or acute infarction. Vascular: No hyperdense vessel or unexpected calcification. Skull: Normal. Negative for fracture or focal lesion. Sinuses/Orbits: No acute finding. Other: None. IMPRESSION: Mild chronic ischemic white matter disease. Mild diffuse cortical atrophy is noted. No acute intracranial abnormality seen. Electronically Signed   By: Marijo Conception M.D.   On: 08/28/2018 06:23    ED COURSE and MDM  Nursing notes and initial vitals signs, including pulse oximetry, reviewed.  Vitals:   08/28/18 0554 08/28/18 0556  BP:  (!) 188/85  Pulse:  66  Resp:  16  Temp: 98 F (36.7 C)   TempSrc: Oral   SpO2:  100%  Weight: 53.5 kg   Height: 5' 3.5" (1.613 m)     PROCEDURES    ED DIAGNOSES     ICD-10-CM   1. Fall in home, initial encounter  W19.XXXA    Y92.009   2. Minor head injury, initial encounter  S09.90XA   3. Traumatic hematoma of scalp, initial encounter  S00.03XA   4. Skin tear  of right forearm without complication, initial encounter  [id number]        Shanon Rosser, MD 08/28/18 [phone]

## 2018-08-28 NOTE — ED Triage Notes (Signed)
Pt sustained mechanical fall while going to bathroom. No LOC, pt remembers all events. Pt has hematoma to back of head and skin tear to right forearm. Pt c/o pain to back of head and neck.

## 2019-03-16 ENCOUNTER — Other Ambulatory Visit: Payer: Self-pay

## 2019-03-16 ENCOUNTER — Ambulatory Visit (INDEPENDENT_AMBULATORY_CARE_PROVIDER_SITE_OTHER): Payer: Medicare Other | Admitting: Diagnostic Neuroimaging

## 2019-03-16 ENCOUNTER — Encounter: Payer: Self-pay | Admitting: Diagnostic Neuroimaging

## 2019-03-16 VITALS — BP 147/70 | HR 78 | Temp 97.2°F | Ht 62.0 in | Wt 133.8 lb

## 2019-03-16 DIAGNOSIS — G20 Parkinson's disease: Secondary | ICD-10-CM

## 2019-03-16 DIAGNOSIS — G20A1 Parkinson's disease without dyskinesia, without mention of fluctuations: Secondary | ICD-10-CM

## 2019-03-16 DIAGNOSIS — R269 Unspecified abnormalities of gait and mobility: Secondary | ICD-10-CM

## 2019-03-16 NOTE — Progress Notes (Signed)
[facility]  PATIENT: Courtney Reed DOB: 1926/03/08  HISTORY FROM: patient REASON FOR VISIT: follow up   HISTORICAL  CHIEF COMPLAINT:  Chief Complaint  Patient presents with  . Parkinson's disease    rm 6 One year FU, living at [facility], one fall- no fractures    HISTORY OF PRESENT ILLNESS:   UPDATE (03/16/19, VRP): Since last visit, doing about the same. Symptoms are stable. Severity is moderate. No alleviating or aggravating factors. Tolerating meds. 1 fall in Aug 2020.   UPDATE (04/02/17, VRP): Since last visit, now having more balance issues. Trying PT. Has had more falls. Tolerating meds. No alleviating or aggravating factors.   UPDATE 04/02/16: Since last visit, doing well. No new issues. Trying to stay active. No falls. Shingles pain resolved. Doing well at [facility].   UPDATE 09/18/15: Since last visit, pain has reduced, but still mild sensitivity and tenderness in right eye. Not on gabapentin anymore.   UPDATE 07/14/15: Since last visit, unfortunately, had new right V1 trigeminal shingles attack on 06/21/15, in spite of shingles vaccine in 2014. Has been to ophthalmology, using eye drops. Now on gabapentin 200mg  TID. Patient not sure if she received anti-viral therapy at onset. Parkinson's dz is stable.  UPDATE 05/02/15: Since last visit, has moved to [place] (1 month ago). Enjoying the transition. Has great attitude and doing well. Still some balance issues but no falls.   UPDATE 03/28/14: Since last visit, no falls. Tolerating meds. Tremor stable. Posture more stooped. Still living independently, driving, taking care of her ADLs.  UPDATE 03/24/13: Since last visit, has been taking ropinirole 1mg  TID + carb/levo 25/100 1 tab TID. No on off fluctuations. No wearing off. Taking meds at 7am, 2:30pm, 7:30pm. Go to sleep at [facility] with mild benefit.  UPDATE 09/21/12: Since last visit, taking ropinirole 2mg  TID and carb/levo  (25/100) half tab TID. Tried full tab of carb/levo, but it makes her too sleepy. Fell down at home a few days ago. Now using 4 point cane.   UPDATE 03/04/12: Since last visit, taking ropinirole 2mg  TID consistently. Nausea has resolved. More tremor and balance difficulty. Has cut down on driving. She feels nervous with driving. She has had some panic attack episodes (chest pain, racing heart beat, sweating, shaking). Saw cardiology.  UPDATE 09/03/11: Feels stable on ropinirole 2mg  TID. Sometimes take 1mg  at noon, if she has AM nausea. Had cholecystectomy, and nausea has improved. No falls. Enjoys gardening.  UPDATE 03/19/11: Doing well. Some tremor in left hand now. Some balance diff.  UPDATE 11/16/10: Tolerating ropinirole 0.5mg  TID.  Tremor getting worse.  More freezing with gait.  Fell at home, but blames this on her shoe getting stuck outside.    UPDATE 08/15/10: Doing about the same; tried ropinirole 0.25 TID but had to reduce due to nausea.  Still with tremor and balance diff.  PRIOR HPI (05/16/10): 86 year old right-handed female with history of hypothyroidism, here for evaluation of tremor since 2011. Patient reports gradual onset tremor, mainly in her right upper extremity, less in her left upper extremity, since 2011.  Tremor is most notable when she is relaxed, sitting and watching TV. The tremor improves and she is doing specific action such as using utensils or holding objects. Anxiety seems to worsen the tremor. She also reports balance difficulty. She fell down in the garden a few weeks ago.  She has had a decrease in her smell and taste abilities over  several years. She has no sleep problems. Denies hallucinations, voice tremor, head tremor, swallowing difficulties. No family history of tremor.   REVIEW OF SYSTEMS: Full 14 system review of systems performed and negative except: as per HPI.   ALLERGIES: Allergies  Allergen Reactions  . Sulfa Antibiotics Rash  . Sulfacetamide Sodium Rash    . Sulfasalazine Rash    HOME MEDICATIONS: Outpatient Medications Prior to Visit  Medication Sig Dispense Refill  . acetaminophen (TYLENOL) 500 MG tablet Take 500 mg by mouth every 6 (six) hours as needed.    Marland Kitchen aspirin EC 81 MG tablet Take 81 mg by mouth daily.    . bisacodyl (BISACODYL) 5 MG EC tablet Take 5 mg by mouth daily as needed for moderate constipation.    . carbidopa-levodopa (SINEMET IR) 25-100 MG tablet Take 1 tablet by mouth 3 (three) times daily.    . carboxymethylcellulose (REFRESH PLUS) 0.5 % SOLN 2 drops 2 (two) times daily as needed.    Marland Kitchen estradiol (ESTRACE) 0.5 MG tablet Take 0.5 mg by mouth daily.    . Glycerin-Hypromellose-PEG 400 0.2-0.2-1 % SOLN as needed.    Marland Kitchen levothyroxine (SYNTHROID) 75 MCG tablet Take 75 mcg by mouth daily before breakfast.    . nystatin (MYCOSTATIN/NYSTOP) powder as needed.    Marland Kitchen omeprazole (PRILOSEC OTC) 20 MG tablet Take 20 mg by mouth daily.    Marland Kitchen rOPINIRole (REQUIP) 2 MG tablet Take 2 mg by mouth 3 (three) times daily.    . sertraline (ZOLOFT) 25 MG tablet Take 25 mg by mouth daily.    . Vitamin D, Ergocalciferol, (DRISDOL) 1.25 MG (50000 UT) CAPS capsule Take 50,000 Units by mouth every 7 (seven) days.     No facility-administered medications prior to visit.    PAST MEDICAL HISTORY: Past Medical History:  Diagnosis Date  . Gallstones   . Hypothyroidism   . Nausea   . Obsessive compulsive disorder   . Parkinson disease ([place])   . Shingles 06/21/15  . Thyroid disease     PAST SURGICAL HISTORY: Past Surgical History:  Procedure Laterality Date  . APPENDECTOMY  1950  . CHOLECYSTECTOMY  07/01/2011   Procedure: LAPAROSCOPIC CHOLECYSTECTOMY WITH INTRAOPERATIVE CHOLANGIOGRAM;  Surgeon: Marcello Moores A. Cornett, MD;  Location: [facility];  Service: General;  Laterality: N/A;  . EYE SURGERY     both cataracts  . hysterecomy - unknown type    . THYROIDECTOMY      FAMILY HISTORY: Family History  Problem Relation Age of Onset   . Heart disease Father        Heart attack  . Heart attack Other   . Cancer Other   . Coronary artery disease Other     SOCIAL HISTORY:  Social History   Socioeconomic History  . Marital status: Widowed    Spouse name: Not on file  . Number of children: 3  . Years of education: Not on file  . Highest education level: Not on file  Occupational History  . Occupation: retired  Tobacco Use  . Smoking status: Never Smoker  . Smokeless tobacco: Never Used  Substance and Sexual Activity  . Alcohol use: No  . Drug use: No  . Sexual activity: Not on file  Other Topics Concern  . Not on file  Social History Narrative  . Not on file   Social Determinants of Health   Financial Resource Strain:   . Difficulty of Paying Living Expenses: Not on file  Food Insecurity:   .  Worried About Charity fundraiser in the Last Year: Not on file  . Ran Out of Food in the Last Year: Not on file  Transportation Needs:   . Lack of Transportation (Medical): Not on file  . Lack of Transportation (Non-Medical): Not on file  Physical Activity:   . Days of Exercise per Week: Not on file  . Minutes of Exercise per Session: Not on file  Stress:   . Feeling of Stress : Not on file  Social Connections:   . Frequency of Communication with Friends and Family: Not on file  . Frequency of Social Gatherings with Friends and Family: Not on file  . Attends Religious Services: Not on file  . Active Member of Clubs or Organizations: Not on file  . Attends Archivist Meetings: Not on file  . Marital Status: Not on file  Intimate Partner Violence:   . Fear of Current or Ex-Partner: Not on file  . Emotionally Abused: Not on file  . Physically Abused: Not on file  . Sexually Abused: Not on file    PHYSICAL EXAM  Vitals:   03/16/19 0952  BP: (!) 147/70  Pulse: 78  Temp: (!) 97.2 F (36.2 C)  Weight: 133 lb 12.8 oz (60.7 kg)  Height: 5\' 2"  (1.575 m)    Not recorded     Body mass index  is 24.47 kg/m.   GENERAL EXAM: Patient in no distress.  CARDIOVASCULAR: Regular rate and rhythm, no murmurs, no carotid bruits  NEUROLOGIC: MENTAL STATUS: awake, alert, language fluent, comprehension intact, naming intact CRANIAL NERVE:  pupils equal and reactive to light, visual fields full to confrontation, extraocular muscles intact, no nystagmus, facial sensation SYMM; facial strength symmetric, uvula midline, shoulder shrug symmetric, tongue midline.  MOTOR: Normal bulk and tone.  NO TREMOR; MILD LEFT ARM RIGIDITY. BRADYKINESIA IN LUE AND LLE. Full strength in the BUE, BLE SENSORY: normal and symmetric to light touch COORDINATION: finger-nose-finger, fine finger movements normal REFLEXES: deep tendon reflexes TRACE; ABSENT AT ANKLES.  GAIT/STATION: UNSTEADY GAIT; USES A CANE; STOOPED POSTURE.    DIAGNOSTIC DATA (LABS, IMAGING, TESTING) - I reviewed patient records, labs, notes, testing and imaging myself where available.  Lab Results  Component Value Date   WBC 8.8 10/18/2017   HGB 13.5 10/18/2017   HCT 42.3 10/18/2017   MCV 92.6 10/18/2017   PLT 198 10/18/2017      Component Value Date/Time   NA 134 (L) 10/18/2017 1221   K 4.0 10/18/2017 1221   CL 98 10/18/2017 1221   CO2 25 10/18/2017 1221   GLUCOSE 116 (H) 10/18/2017 1221   BUN 15 10/18/2017 1221   CREATININE 0.62 10/18/2017 1221   CALCIUM 8.8 (L) 10/18/2017 1221   PROT 6.9 10/18/2017 1221   ALBUMIN 3.8 10/18/2017 1221   AST 26 10/18/2017 1221   ALT 16 10/18/2017 1221   ALKPHOS 63 10/18/2017 1221   BILITOT 0.5 10/18/2017 1221   GFRNONAA >60 10/18/2017 1221   GFRAA >60 10/18/2017 1221   No results found for: CHOL Lab Results  Component Value Date   HGBA1C  03/10/2008    5.9 (NOTE)   The ADA recommends the following therapeutic goal for glycemic   control related to Hgb A1C measurement:   Goal of Therapy:   < 7.0% Hgb A1C   Reference: American Diabetes Association: Clinical Practice   Recommendations 2008,  Diabetes Care,  2008, 31:(Suppl 1).   No results found for: [id number] Lab Results  Component  Value Date   TSH 2.131 *Test methodology is 3rd generation TSH* 03/10/2008   06/08/10 MRI brain  - chronic stable changes of microvascular ischemia and mild generalized atrophy. No significant change compared with MRI 03/09/08.   ASSESSMENT AND PLAN  86 y.o. right-handed female with hypothyroidism, here with resting tremor, bradykinesia and postural instability.  Symptoms and exam are consistent with Parkinson's disease (akinetic rigid) --> stable.  Right V1 shingles attack on 06/21/15 (herpes zoster ophthalmicus). Gradually improving.    Dx:  Parkinson's disease ([place])  Gait difficulty   PLAN:  PARKINSON'S DZ (akinetic rigid; stable) - continue ropinirole 2mg  three times a day + carb/levo 25/100 1 tab three times a day  - continue physical therapy  - caution with balance; use cane / walker   Return for pending if symptoms worsen or fail to improve, return to PCP.  I spent 25 minutes of face-to-face and non-face-to-face time with patient.  This included previsit chart review, lab review, study review, order entry, electronic health record documentation, patient education.     Penni Bombard, MD [id number], [id number] AM Certified in Neurology, Neurophysiology and Neuroimaging  [facility] [address], [place], [place] [address] [phone]

## 2019-04-24 IMAGING — DX DG ABDOMEN ACUTE W/ 1V CHEST
3 series · 3 of 3 positions shown · non-contrast
Comparison: Chest x-ray dated June 28, 2011.

CLINICAL DATA: Nausea and vomiting after eating the left yesterday.

EXAM:
DG ABDOMEN ACUTE W/ 1V CHEST

[chest pa]
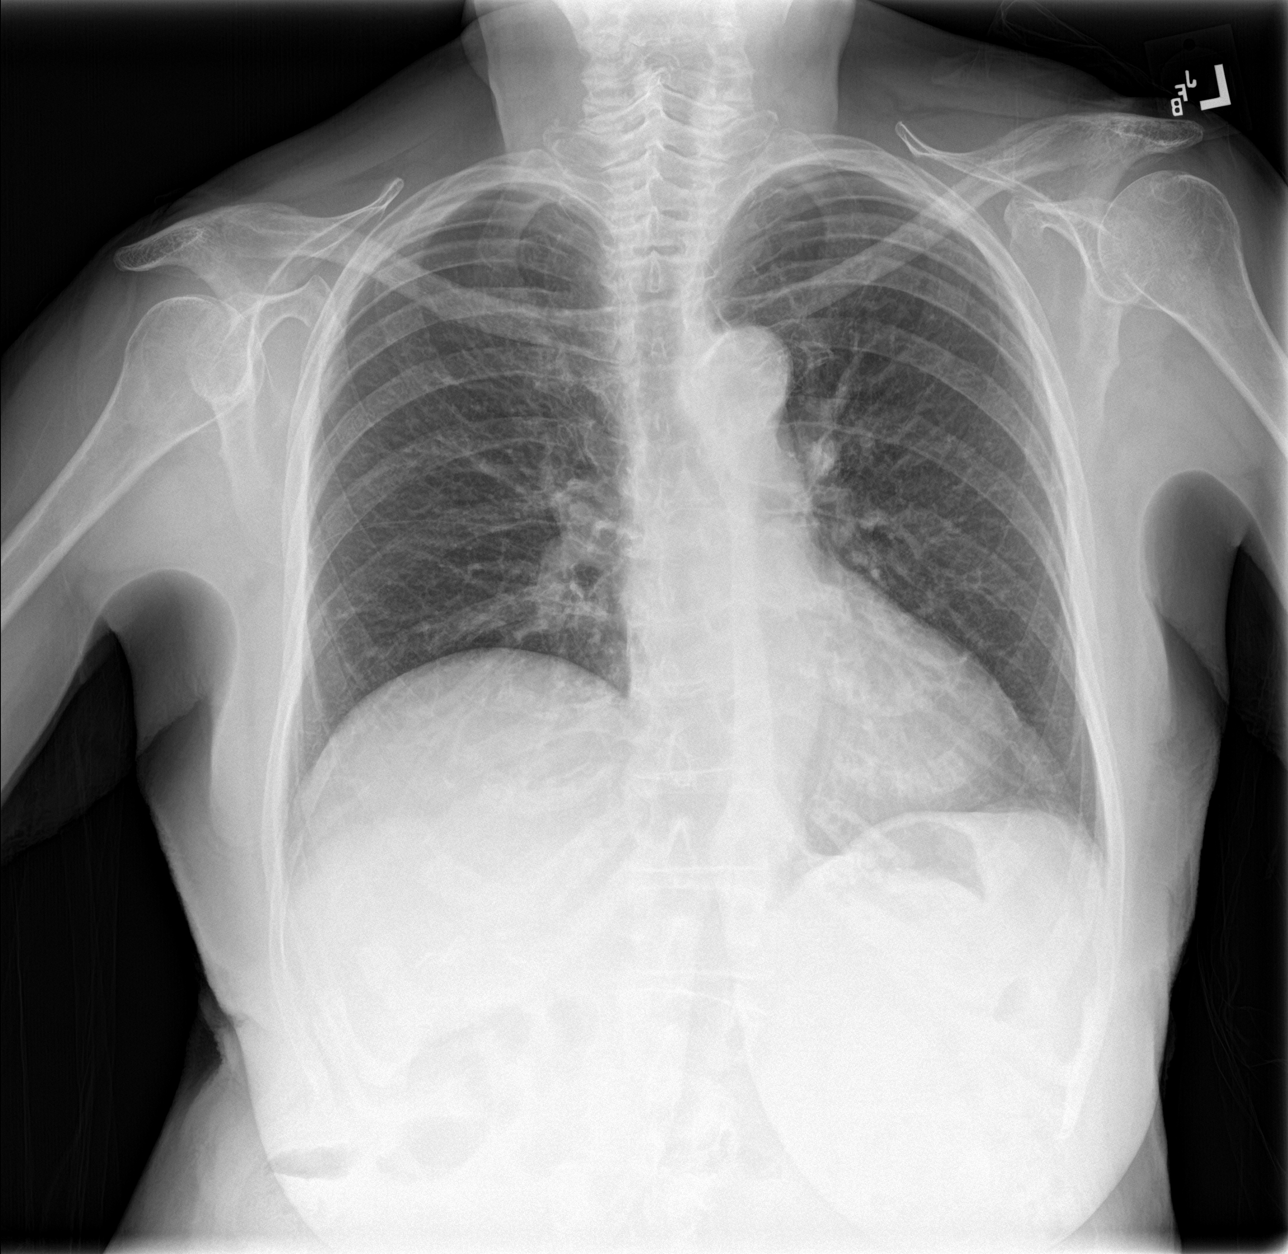

[abdomen erect]
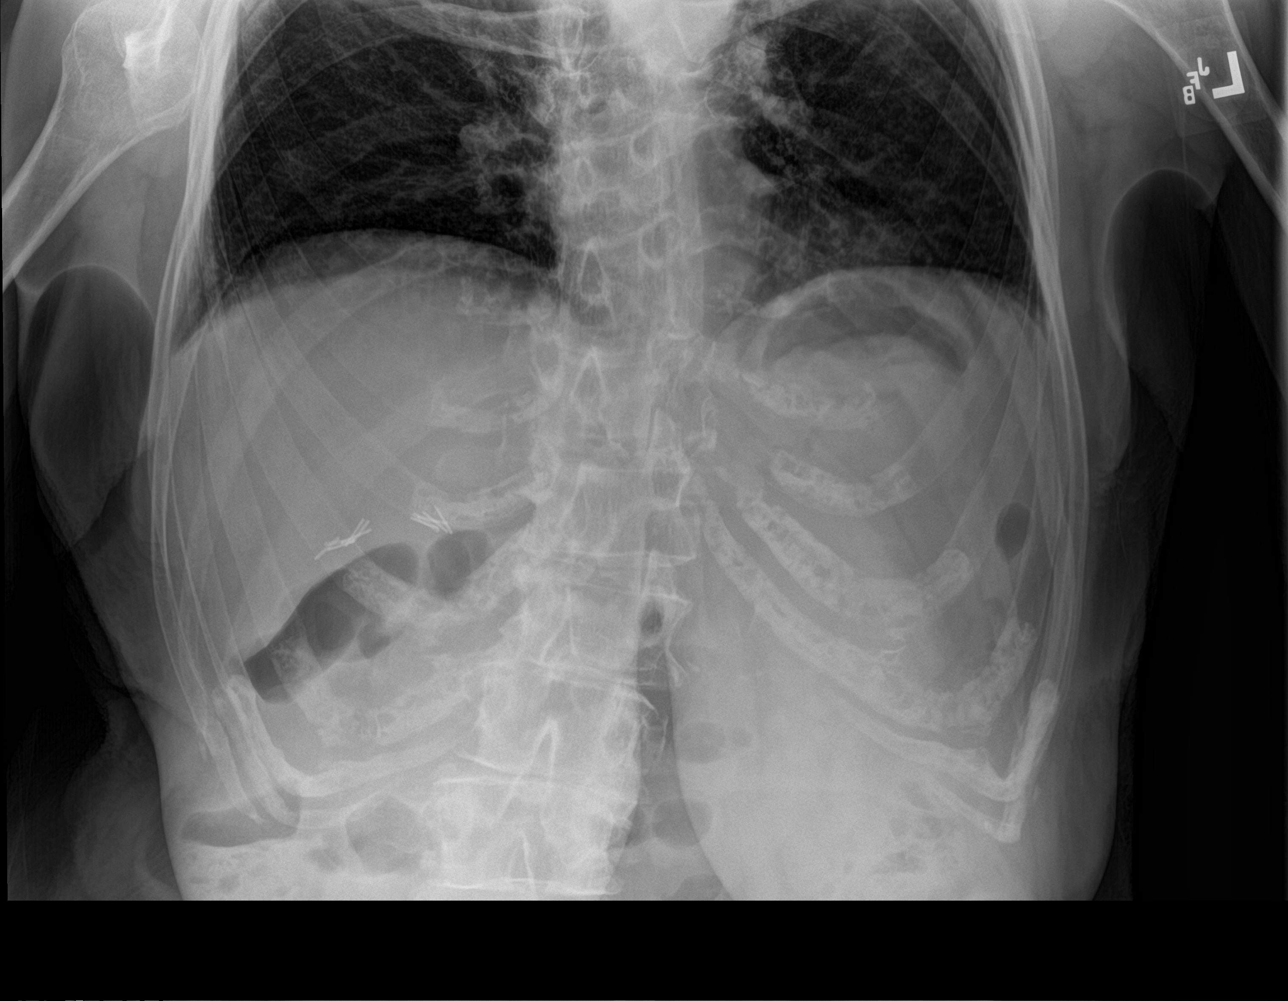

[abdomen supine]
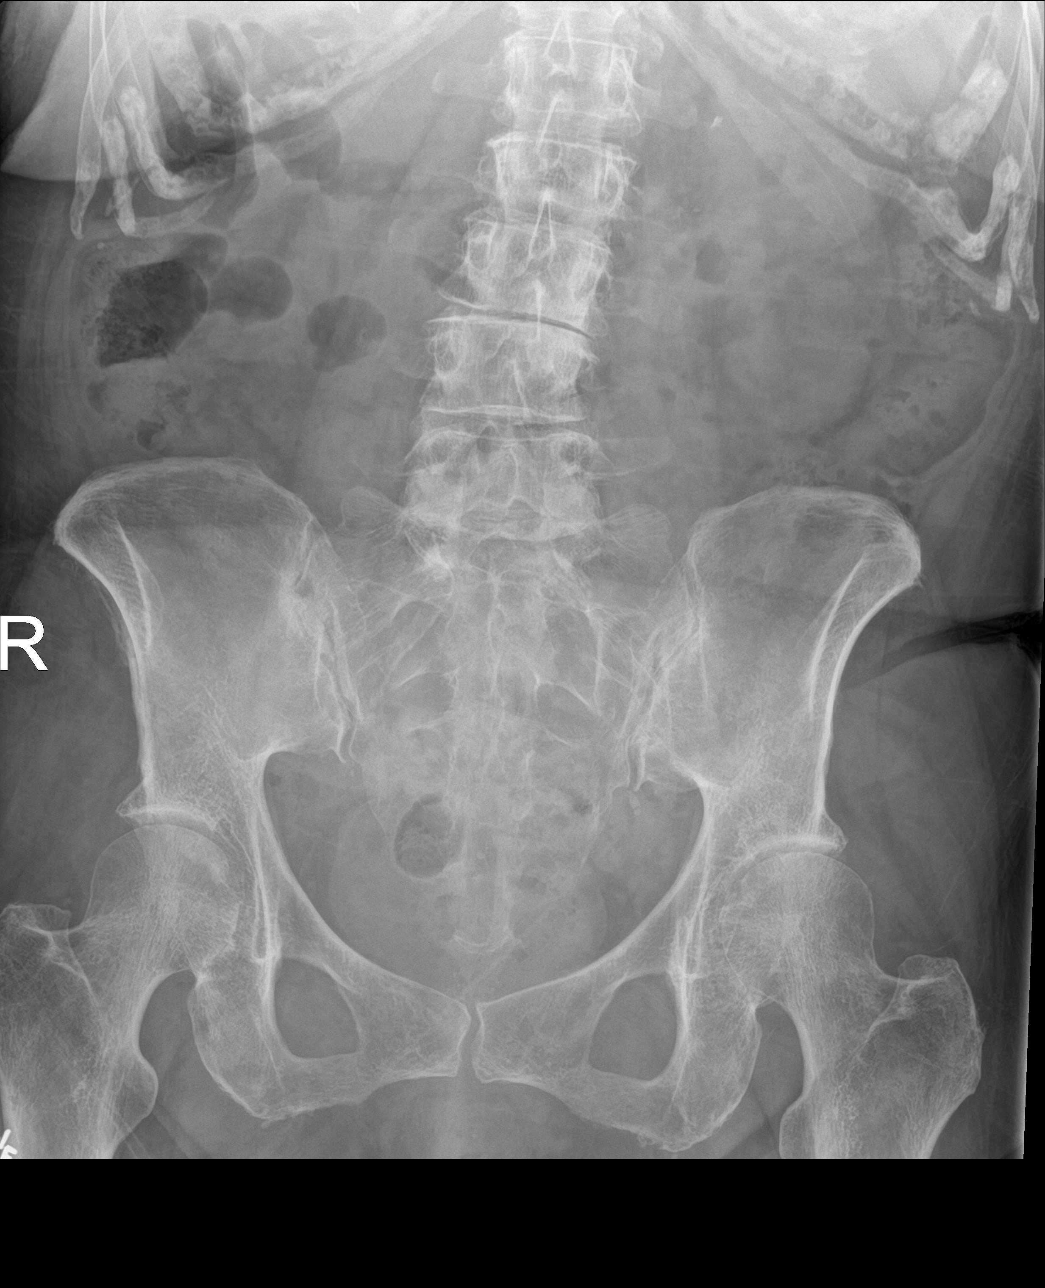

[3 of 3 positions shown; findings below may reference images not displayed]

FINDINGS: The heart size and mediastinal contours are within normal limits.
Normal pulmonary vascularity. Atherosclerotic calcification of the
aortic arch. No focal consolidation, pleural effusion, or
pneumothorax.

There is no evidence of dilated bowel loops or free intraperitoneal
air. No radiopaque calculi or other significant radiographic
abnormality is seen. Prior cholecystectomy. No acute osseous
abnormality.
IMPRESSION: Negative abdominal radiographs.  No acute cardiopulmonary disease.

## 2019-04-29 ENCOUNTER — Other Ambulatory Visit: Payer: Self-pay | Admitting: Diagnostic Neuroimaging

## 2019-04-29 NOTE — Telephone Encounter (Signed)
Refilled Requip x 3 months with note to pharmacy: further refills thru PCP.

## 2019-05-06 ENCOUNTER — Other Ambulatory Visit: Payer: Self-pay | Admitting: Diagnostic Neuroimaging

## 2019-05-06 NOTE — Telephone Encounter (Signed)
Refilled x 3 months with note to pharmacy, future refills thru PCP.

## 2019-07-05 ENCOUNTER — Other Ambulatory Visit: Payer: Self-pay | Admitting: Diagnostic Neuroimaging

## 2019-07-14 ENCOUNTER — Other Ambulatory Visit: Payer: Self-pay | Admitting: Diagnostic Neuroimaging

## 2019-08-09 ENCOUNTER — Other Ambulatory Visit: Payer: Self-pay | Admitting: Diagnostic Neuroimaging

## 2019-10-02 ENCOUNTER — Other Ambulatory Visit: Payer: Self-pay | Admitting: Diagnostic Neuroimaging

## 2020-01-10 ENCOUNTER — Ambulatory Visit: Payer: Medicare Other | Admitting: Family Medicine

## 2021-04-19 ENCOUNTER — Observation Stay (HOSPITAL_COMMUNITY): Payer: Medicare Other

## 2021-04-19 ENCOUNTER — Emergency Department (HOSPITAL_COMMUNITY): Payer: Medicare Other

## 2021-04-19 ENCOUNTER — Encounter (HOSPITAL_COMMUNITY): Payer: Self-pay | Admitting: Internal Medicine

## 2021-04-19 ENCOUNTER — Observation Stay (HOSPITAL_COMMUNITY)
Admission: EM | Admit: 2021-04-19 | Discharge: 2021-04-19 | Disposition: A | Discharge status: Home / Self Care | Payer: Medicare Other | Attending: Internal Medicine | Admitting: Internal Medicine

## 2021-04-19 DIAGNOSIS — R0600 Dyspnea, unspecified: Secondary | ICD-10-CM | POA: Diagnosis not present

## 2021-04-19 DIAGNOSIS — Z66 Do not resuscitate: Secondary | ICD-10-CM | POA: Diagnosis present

## 2021-04-19 DIAGNOSIS — I4510 Unspecified right bundle-branch block: Secondary | ICD-10-CM | POA: Insufficient documentation

## 2021-04-19 DIAGNOSIS — Z8249 Family history of ischemic heart disease and other diseases of the circulatory system: Secondary | ICD-10-CM | POA: Insufficient documentation

## 2021-04-19 DIAGNOSIS — R4781 Slurred speech: Secondary | ICD-10-CM | POA: Diagnosis not present

## 2021-04-19 DIAGNOSIS — I6602 Occlusion and stenosis of left middle cerebral artery: Secondary | ICD-10-CM | POA: Diagnosis not present

## 2021-04-19 DIAGNOSIS — E039 Hypothyroidism, unspecified: Secondary | ICD-10-CM | POA: Diagnosis present

## 2021-04-19 DIAGNOSIS — I6522 Occlusion and stenosis of left carotid artery: Secondary | ICD-10-CM | POA: Insufficient documentation

## 2021-04-19 DIAGNOSIS — G20 Parkinson's disease: Secondary | ICD-10-CM

## 2021-04-19 DIAGNOSIS — I6789 Other cerebrovascular disease: Secondary | ICD-10-CM | POA: Diagnosis not present

## 2021-04-19 DIAGNOSIS — R4182 Altered mental status, unspecified: Secondary | ICD-10-CM | POA: Diagnosis present

## 2021-04-19 DIAGNOSIS — I700 Atherosclerosis of aorta: Secondary | ICD-10-CM | POA: Diagnosis not present

## 2021-04-19 DIAGNOSIS — I671 Cerebral aneurysm, nonruptured: Secondary | ICD-10-CM | POA: Diagnosis not present

## 2021-04-19 DIAGNOSIS — R946 Abnormal results of thyroid function studies: Secondary | ICD-10-CM | POA: Insufficient documentation

## 2021-04-19 DIAGNOSIS — I6501 Occlusion and stenosis of right vertebral artery: Secondary | ICD-10-CM | POA: Insufficient documentation

## 2021-04-19 DIAGNOSIS — F0280 Dementia in other diseases classified elsewhere without behavioral disturbance: Secondary | ICD-10-CM | POA: Insufficient documentation

## 2021-04-19 DIAGNOSIS — F429 Obsessive-compulsive disorder, unspecified: Secondary | ICD-10-CM | POA: Diagnosis not present

## 2021-04-19 DIAGNOSIS — R4701 Aphasia: Secondary | ICD-10-CM | POA: Diagnosis not present

## 2021-04-19 DIAGNOSIS — R531 Weakness: Secondary | ICD-10-CM | POA: Diagnosis not present

## 2021-04-19 DIAGNOSIS — Z79899 Other long term (current) drug therapy: Secondary | ICD-10-CM | POA: Diagnosis not present

## 2021-04-19 DIAGNOSIS — Z79890 Hormone replacement therapy (postmenopausal): Secondary | ICD-10-CM | POA: Diagnosis not present

## 2021-04-19 DIAGNOSIS — I672 Cerebral atherosclerosis: Secondary | ICD-10-CM | POA: Diagnosis not present

## 2021-04-19 DIAGNOSIS — G20A1 Parkinson's disease without dyskinesia, without mention of fluctuations: Secondary | ICD-10-CM | POA: Diagnosis present

## 2021-04-19 DIAGNOSIS — G9341 Metabolic encephalopathy: Secondary | ICD-10-CM | POA: Diagnosis not present

## 2021-04-19 DIAGNOSIS — R29818 Other symptoms and signs involving the nervous system: Secondary | ICD-10-CM | POA: Insufficient documentation

## 2021-04-19 DIAGNOSIS — I6623 Occlusion and stenosis of bilateral posterior cerebral arteries: Secondary | ICD-10-CM | POA: Diagnosis not present

## 2021-04-19 LAB — DIFFERENTIAL
Abs Immature Granulocytes: 0.02 10*3/uL (ref 0.00–0.07)
Basophils Absolute: 0.1 10*3/uL (ref 0.0–0.1)
Basophils Relative: 1 %
Eosinophils Absolute: 0.3 10*3/uL (ref 0.0–0.5)
Eosinophils Relative: 3 %
Immature Granulocytes: 0 %
Lymphocytes Relative: 35 %
Lymphs Abs: 2.7 10*3/uL (ref 0.7–4.0)
Monocytes Absolute: 0.8 10*3/uL (ref 0.1–1.0)
Monocytes Relative: 10 %
Neutro Abs: 4 10*3/uL (ref 1.7–7.7)
Neutrophils Relative %: 51 %

## 2021-04-19 LAB — CBC
HCT: 40.3 % (ref 36.0–46.0)
Hemoglobin: 12.6 g/dL (ref 12.0–15.0)
MCH: 29.9 pg (ref 26.0–34.0)
MCHC: 31.3 g/dL (ref 30.0–36.0)
MCV: 95.5 fL (ref 80.0–100.0)
Platelets: 179 10*3/uL (ref 150–400)
RBC: 4.22 MIL/uL (ref 3.87–5.11)
RDW: 13.3 % (ref 11.5–15.5)
WBC: 7.8 10*3/uL (ref 4.0–10.5)
nRBC: 0 % (ref 0.0–0.2)

## 2021-04-19 LAB — URINALYSIS, ROUTINE W REFLEX MICROSCOPIC
Bilirubin Urine: NEGATIVE
Glucose, UA: NEGATIVE mg/dL
Hgb urine dipstick: NEGATIVE
Ketones, ur: NEGATIVE mg/dL
Nitrite: NEGATIVE
Protein, ur: NEGATIVE mg/dL
Specific Gravity, Urine: 1.043 — ABNORMAL HIGH (ref 1.005–1.030)
pH: 6 (ref 5.0–8.0)

## 2021-04-19 LAB — COMPREHENSIVE METABOLIC PANEL
ALT: 8 U/L (ref 0–44)
AST: 17 U/L (ref 15–41)
Albumin: 3.6 g/dL (ref 3.5–5.0)
Alkaline Phosphatase: 64 U/L (ref 38–126)
Anion gap: 6 (ref 5–15)
BUN: 17 mg/dL (ref 8–23)
CO2: 24 mmol/L (ref 22–32)
Calcium: 8.7 mg/dL — ABNORMAL LOW (ref 8.9–10.3)
Chloride: 108 mmol/L (ref 98–111)
Creatinine, Ser: 0.75 mg/dL (ref 0.44–1.00)
GFR, Estimated: >60 mL/min (ref >60)
Glucose, Bld: 93 mg/dL (ref 70–99)
Potassium: 4 mmol/L (ref 3.5–5.1)
Sodium: 138 mmol/L (ref 135–145)
Total Bilirubin: 0.2 mg/dL — ABNORMAL LOW (ref 0.3–1.2)
Total Protein: 6.3 g/dL — ABNORMAL LOW (ref 6.5–8.1)

## 2021-04-19 LAB — I-STAT CHEM 8, ED
BUN: 20 mg/dL (ref 8–23)
Calcium, Ion: 1.06 mmol/L — ABNORMAL LOW (ref 1.15–1.40)
Chloride: 105 mmol/L (ref 98–111)
Creatinine, Ser: 0.8 mg/dL (ref 0.44–1.00)
Glucose, Bld: 92 mg/dL (ref 70–99)
HCT: 38 % (ref 36.0–46.0)
Hemoglobin: 12.9 g/dL (ref 12.0–15.0)
Potassium: 4.1 mmol/L (ref 3.5–5.1)
Sodium: 140 mmol/L (ref 135–145)
TCO2: 28 mmol/L (ref 22–32)

## 2021-04-19 LAB — APTT: aPTT: 36 seconds (ref 24–36)

## 2021-04-19 LAB — PROTIME-INR
INR: 1.1 (ref 0.8–1.2)
Prothrombin Time: 14.2 seconds (ref 11.4–15.2)

## 2021-04-19 LAB — FOLATE: Folate: 16.8 ng/mL (ref >5.9)

## 2021-04-19 LAB — VITAMIN B12: Vitamin B-12: 233 pg/mL (ref 180–914)

## 2021-04-19 LAB — AMMONIA: Ammonia: 17 umol/L (ref 9–35)

## 2021-04-19 LAB — CBG MONITORING, ED: Glucose-Capillary: 97 mg/dL (ref 70–99)

## 2021-04-19 LAB — TSH: TSH: 7.017 u[IU]/mL — ABNORMAL HIGH (ref 0.350–4.500)

## 2021-04-19 MED ORDER — SODIUM CHLORIDE 0.9% FLUSH: 3.0000 mL | Freq: Once | INTRAVENOUS | Status: DC

## 2021-04-19 MED ORDER — IOHEXOL 350 MG/ML SOLN
100.0000 mL | Freq: Once | INTRAVENOUS | Status: AC | PRN
Start: 2021-04-19 — End: 2021-04-19
  Administered 2021-04-19: 100 mL via INTRAVENOUS

## 2021-04-19 MED ORDER — ONDANSETRON HCL 4 MG/2ML IJ SOLN
4.0000 mg | Freq: Four times a day (QID) | INTRAMUSCULAR | Status: DC | PRN
Start: 2021-04-19 — End: 2021-04-19

## 2021-04-19 NOTE — Assessment & Plan Note (Addendum)
-  With dementia ?-Continue Requip, Sinemet, Zoloft ?

## 2021-04-19 NOTE — Assessment & Plan Note (Signed)
-  I have discussed code status with the patient's daughter and the patient would not desire resuscitation and would prefer to die a natural death should that situation arise. ?-She will need a gold out of facility DNR form at the time of discharge ?

## 2021-04-19 NOTE — Consult Note (Signed)
Neurology Consultation ?Reason for Consult: Altered mental status ?Referring Physician: Viona Gilmore ? ?CC: Altered mental status ? ?History is obtained from: Patient ? ?HPI: Courtney Reed is a 88 y.o. female who was last in her normal state of health when she went to bed around 8 PM.  At baseline, she is able to attend to most of her ADLs with only minimal assistance.  She is unable to get a tray on her own, but needs it set down for her but then is able to feed herself.  Around 3:30 AM, staff at the facility heard her cry out and when they went in to attend her found her to be combative and confused.  This is different than her baseline.  On EMS transport, they reported left-sided weakness, and that when she was swinging at them with her left hand she was weaker than on the right.  She improved and by the time of my evaluation was moving both sides equally. ? ? ?LKW: 8 PM ?tpa given?: no, out of window ?Premorbid modified rankin scale: 3 ? ? ? ? ?ROS:  Unable to obtain due to altered mental status.  ? ?Past Medical History:  ?Diagnosis Date  ? Gallstones   ? Hypothyroidism   ? Nausea   ? Obsessive compulsive disorder   ? Parkinson disease ([place])   ? Shingles 06/21/15  ? Thyroid disease   ? ? ? ?Family History  ?Problem Relation Age of Onset  ? Heart disease Father   ?     Heart attack  ? Heart attack Other   ? Cancer Other   ? Coronary artery disease Other   ? ? ? ?Social History:  reports that she has never smoked. She has never used smokeless tobacco. She reports that she does not drink alcohol and does not use drugs. ? ? ?Exam: ?Current vital signs: ?BP 125/86 (BP Location: Right Arm)   Pulse 66   Temp 98.1 ?F (36.7 ?C) (Axillary)   Resp 18   Wt 60 kg   SpO2 97%   BMI 24.19 kg/m?  ?Vital signs in last 24 hours: ?Temp:  [98.1 ?F (36.7 ?C)] 98.1 ?F (36.7 ?C) (04/13 0516) ?Pulse Rate:  [66] 66 (04/13 0516) ?Resp:  [18] 18 (04/13 0516) ?BP: (125)/(86) 125/86 (04/13 0516) ?SpO2:  [97 %] 97 % (04/13 0516) ?Weight:   [60 kg] 60 kg (04/13 0500) ? ? ?Physical Exam  ?Constitutional: Appears elderly ?Neuro: ?Mental Status: ?Patient is awake, states that she wants to go home, but does not answer other questions.  She does follow some commands, with repeated prompting. ?Cranial Nerves: ?II: She blinks to threat bilaterally. Pupils are equal, round, and reactive to light.   ?III,IV, VI: EOMI without ptosis or diploplia.  ?V: Facial sensation is symmetric to temperature ?VII: Facial movement is symmetric.  ?Motor: ?She moves all extremities against gravity well, no drift ?Sensory: ?She responds like to stimulation bilaterally ?Cerebellar: ?She does not perform ? ? ? ? ?I have reviewed labs in epic and the results pertinent to this consultation are: ?Creatinine 0.8 ? ?I have reviewed the images obtained: CT/CTA/CTP-negative ? ?Impression: 88 year old female with dementia and Parkinson's disease who presents with altered mental status.  At this point, the etiology is unclear but broad differential is need to be kept in mind.  If there truly was left-sided weakness (I think it is unclear) then ischemic stroke or postictal Todd's paralysis would be considered.  Delirium would also be a consideration. ? ?Recommendations: ?  1) MRI brain ?2) EEG ?3) screening for infectious etiology such as urinary tract infection, pneumonia, etc. per ER/internal medicine ?4) ammonia, TSH ? ? ?Roland Rack, MD ?[facility] ?[phone] ? ?If 7pm- 7am, please page neurology on call as listed in [place]. ? ?

## 2021-04-19 NOTE — Discharge Instructions (Addendum)
A urine culture has been sent.  You will be notified if it shows an infection.  Follow-up with your doctors for further work-up. ?
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
## 2021-04-19 NOTE — Consult Note (Addendum)
?Consult Note ? ? ?Patient: Courtney Reed XAJ:[id number] DOB: [birth year] ?DOA: 04/19/2021 ?DOS: the patient was seen and examined on 04/19/2021 ?PCP: Alroy Dust, L.Marlou Sa, MD  ?Patient coming from: ALF/ILF - HPNP ILF at [facility]; NOK: Daughter, Chauncey Fischer, [phone] ? ? ?Chief Complaint: AMS ? ?HPI: Courtney Reed is a 88 y.o. female with medical history significant of hypothyroidism; OCD; and Parkinson's with dementia presenting with AMS.   She was recently treated for RLE cellulitis.  She is awake and alert and cursing at me.   ? ?I called and spoke with her daughter.  I explained that she is awake and alert and cantankerous - which is not necessarily different from her usual baseline.  She gets "a little cranky" when she misses her home meds.  She has a DNR order.  Her husband remotely had a stroke - she is totally opposed to that.  MRI is negative.  If EEG is negative then family is good with transfer back to her facility today - she gets agitated if she is hospitalized.   ? ? ? ?ER Course:  Mental status changes, stroke, seizure, ?.  Admission for MRI and EEG. ? ? ? ? ?Review of Systems: Unable to review all systems due to lack of cooperation from patient. ?Past Medical History:  ?Diagnosis Date  ? Gallstones   ? Hypothyroidism   ? Obsessive compulsive disorder   ? Parkinson disease ([place])   ? Shingles 06/21/2015  ? ?Past Surgical History:  ?Procedure Laterality Date  ? APPENDECTOMY  1950  ? CHOLECYSTECTOMY  07/01/2011  ? Procedure: LAPAROSCOPIC CHOLECYSTECTOMY WITH INTRAOPERATIVE CHOLANGIOGRAM;  Surgeon: Joyice Faster. Cornett, MD;  Location: [place];  Service: General;  Laterality: N/A;  ? EYE SURGERY    ? both cataracts  ? hysterecomy - unknown type    ? THYROIDECTOMY    ? ?Social History:  reports that she has never smoked. She has never used smokeless tobacco. She reports that she does not drink alcohol and does not use drugs. ? ?Allergies  ?Allergen Reactions  ? Sulfa Antibiotics Rash  ?  Sulfacetamide Sodium Rash  ? Sulfasalazine Rash  ? ? ?Family History  ?Problem Relation Age of Onset  ? Heart disease Father   ?     Heart attack  ? Heart attack Other   ? Cancer Other   ? Coronary artery disease Other   ? ? ?Prior to Admission medications   ?Medication Sig Start Date End Date Taking? Authorizing Provider  ?carbidopa-levodopa (SINEMET IR) 25-100 MG tablet TAKE ONE TABLET BY MOUTH THREE TIMES A DAY ?Patient taking differently: Take 1 tablet by mouth 3 (three) times daily. 10/04/19  Yes Penumalli, Earlean Polka, MD  ?cholecalciferol (VITAMIN D) 25 MCG (1000 UNIT) tablet Take 1,000 Units by mouth every Monday, Wednesday, and Friday.   Yes [provider]  ?estradiol (ESTRACE) 0.5 MG tablet Take 0.5 mg by mouth daily.   Yes [provider]  ?ketoconazole (NIZORAL) 2 % cream Apply 1 application. topically daily.   Yes [provider]  ?levothyroxine (SYNTHROID) 75 MCG tablet Take 75 mcg by mouth daily before breakfast.   Yes [provider]  ?omeprazole (PRILOSEC OTC) 20 MG tablet Take 20 mg by mouth daily.   Yes [provider]  ?rOPINIRole (REQUIP) 1 MG tablet Take 1 mg by mouth 3 (three) times daily.   Yes [provider]  ?sertraline (ZOLOFT) 50 MG tablet Take 50 mg by mouth daily. 03/10/19  Yes [provider]  ?talc (ZEASORB) powder Apply 1 application. topically as needed (rash).   Yes [provider]  ?nystatin (MYCOSTATIN/NYSTOP) powder as needed. 01/28/18   [provider]  ?rOPINIRole (REQUIP) 2 MG tablet TAKE 1 TABLET (2 MG TOTAL) BY MOUTH THREE TIMES DAILY. ?Patient not taking: Reported on 04/19/2021 04/29/19   Penni Bombard, MD  ? ? ?Physical Exam: ?Vitals:  ? 04/19/21 1130 04/19/21 1145 04/19/21 1200 04/19/21 1215  ?BP: (!) 142/67 (!) 135/59 133/65 (!) 119/57  ?Pulse: 69 (!) 59 60 61  ?Resp: '19 14 15 15  '$ ?Temp:      ?TempSrc:      ?SpO2: 91% 94% 96% 95%  ?Weight:      ? ?General:  Appears calm and comfortable and  is in NAD, very cantankerous ?Eyes:  EOMI, normal lids, iris ?ENT:  grossly normal hearing, lips & tongue, mmm ?Neck:  no LAD, masses or thyromegaly ?Cardiovascular:  RRR, no m/r/g. No LE edema.  ?Respiratory:   CTA bilaterally with no wheezes/rales/rhonchi.  Normal respiratory effort. ?Abdomen:  soft, NT, ND ?Skin:  no rash or induration seen on limited exam ?Musculoskeletal:  grossly normal tone BUE/BLE, good ROM, no bony abnormality ?Psychiatric:  cantankerous mood and affect, speech fluent but inappropriate (told me "kiss my ass" among other unfortunate phrases) ?Neurologic:  unable to perform ? ? ?Radiological Exams on Admission: ?Independently reviewed - see discussion in A/P where applicable ? ?MR BRAIN WO CONTRAST ? ?Result Date: 04/19/2021 ?CLINICAL DATA:  88 year old female code stroke presentation. EXAM: MRI HEAD WITHOUT CONTRAST TECHNIQUE: Multiplanar, multiecho pulse sequences of the brain and surrounding structures were obtained without intravenous contrast. COMPARISON:  CT head and CTA head and neck earlier today. Brain MRI 03/09/2008. FINDINGS: Brain: No convincing restricted diffusion, acute infarction. Cerebral volume is within normal limits for age. No midline shift, mass effect, evidence of mass lesion, ventriculomegaly, extra-axial collection or acute intracranial hemorrhage. Cervicomedullary junction and pituitary are within normal limits. Patchy bilateral cerebral white matter T2 and FLAIR hyperintensity has only mildly progressed since 2010. But moderate T2 heterogeneity throughout the bilateral deep gray nuclei has increased. And patchy mild to moderate T2 heterogeneity in the pons has increased. No cortical encephalomalacia or chronic cerebral blood products identified. Vascular: Are stable since 2010.  Intracranial artery tortuosity. Skull and upper cervical spine: Negative for age. Visualized bone marrow signal is within normal limits. Sinuses/Orbits: Negative. Other: Grossly normal  visible internal auditory structures. Negative visible orbit and scalp soft tissues. IMPRESSION: 1. No acute intracranial abnormality. 2. Chronic small vessel disease with progression since 2010. Electronically Signed   By: Genevie Ann M.D.   On: 04/19/2021 09:19  ? ?DG CHEST PORT 1 VIEW ? ?Result Date: 04/19/2021 ?CLINICAL DATA:  Difficulty breathing EXAM: PORTABLE CHEST 1 VIEW COMPARISON:  03/10/2021 FINDINGS: Transverse diameter of heart is slightly increased. Right hemidiaphragm is elevated. There are no signs of pulmonary edema or focal pulmonary consolidation. There is no significant pleural effusion or pneumothorax. IMPRESSION: No active disease. Electronically Signed   By: Elmer Picker M.D.   On: 04/19/2021 10:02  ? ?EEG adult ? ?Result Date: 04/19/2021 ?Lora Havens, MD     04/19/2021 11:36 AM Patient Name: KHALIE WINCE MRN: [id number] Epilepsy Attending: Lora Havens Referring Physician/Provider: Donnetta Simpers, MD Date: 04/19/2021 Duration: 22.47 mins Patient history: 88 year old female with dementia and Parkinson's disease who presents with altered mental status. EEG to evaluate for seizure Level of alertness: Awake, asleep AEDs during EEG study:  None Technical aspects: This EEG study was done with scalp electrodes positioned according to the 10-20 International system of electrode placement. Electrical activity was acquired at a sampling rate of '500Hz'$  and reviewed with a high frequency filter of '70Hz'$  and a low frequency filter of '1Hz'$ . EEG data were recorded continuously and digitally stored. Description: During awake state, no clear posterior dominant rhythm was noted.. Sleep was characterized by vertex waves, sleep spindles (12 to 14 Hz), maximal frontocentral region.  EEG showed continuous generalized predominantly 5 to 6 Hz theta slowing admixed with intermittent generalized 2 to 3 Hz delta slowing. Hyperventilation and photic stimulation were not performed.   ABNORMALITY - Continuous  slow, generalized IMPRESSION: This study is suggestive of moderate diffuse encephalopathy, nonspecific etiology.  No seizures or epileptiform discharges were seen throughout the recording. Priyanka Barbra Sarks  ?

## 2021-04-19 NOTE — ED Provider Notes (Signed)
?  Physical Exam  ?BP (!) 119/57   Pulse 61   Temp 98.1 ?F (36.7 ?C) (Axillary)   Resp 15   Wt 60 kg   SpO2 95%   BMI 24.19 kg/m?  ? ?Physical Exam ? ?Procedures  ?Procedures ? ?ED Course / MDM  ?  ?Medical Decision Making ?Amount and/or Complexity of Data Reviewed ?Labs: ordered. ?Radiology: ordered. ? ?Risk ?Prescription drug management. ? ? ?Patient received in signout from Dr. Sedonia Small.  Confusion and potentially lateralizing symptoms.  Plan was for admission.  Discussed with Dr. Lorin Mercy.  However she had further discussions with family.  MRI reassuring and EEG reassuring.  Patient gets more altered and confused for prolonged period times after she comes in the hospital.  Will discharge back to nursing home.  Could have more work-up as an outpatient.  Urine does show some white cells with only rare bacteria..  Unable to tell if there are symptoms.  Urine culture sent and if comes back positive would treat but will not empirically treat at this time. ? ? ? ? ?  ?Davonna Belling, MD ?04/19/21 1246 ? ?

## 2021-04-19 NOTE — ED Provider Notes (Signed)
?[place] DEPT ?[facility] Emergency Department ?Provider Note ?MRN:  [id number]  ?Arrival date & time: 04/19/21    ? ?Chief Complaint   ?Code stroke ?History of Present Illness   ?Courtney Reed is a 88 y.o. year-old female with no pertinent past medical presenting to the ED with chief complaint of code stroke. ? ?Went to bed at 8 or 9 PM, at 3 AM found to be aphasic with left-sided deficits.  Code stroke initiated. ? ?Review of Systems  ?I was unable to obtain a full/accurate HPI, PMH, or ROS due to the patient's altered mental status. ? ?Patient's Health History   ? ?Past Medical History:  ?Diagnosis Date  ? Gallstones   ? Hypothyroidism   ? Nausea   ? Obsessive compulsive disorder   ? Parkinson disease ([place])   ? Shingles 06/21/15  ? Thyroid disease   ?  ?Past Surgical History:  ?Procedure Laterality Date  ? APPENDECTOMY  1950  ? CHOLECYSTECTOMY  07/01/2011  ? Procedure: LAPAROSCOPIC CHOLECYSTECTOMY WITH INTRAOPERATIVE CHOLANGIOGRAM;  Surgeon: Joyice Faster. Cornett, MD;  Location: [place];  Service: General;  Laterality: N/A;  ? EYE SURGERY    ? both cataracts  ? hysterecomy - unknown type    ? THYROIDECTOMY    ?  ?Family History  ?Problem Relation Age of Onset  ? Heart disease Father   ?     Heart attack  ? Heart attack Other   ? Cancer Other   ? Coronary artery disease Other   ?  ?Social History  ? ?Socioeconomic History  ? Marital status: Widowed  ?  Spouse name: Not on file  ? Number of children: 3  ? Years of education: Not on file  ? Highest education level: Not on file  ?Occupational History  ? Occupation: retired  ?Tobacco Use  ? Smoking status: Never  ? Smokeless tobacco: Never  ?Substance and Sexual Activity  ? Alcohol use: No  ? Drug use: No  ? Sexual activity: Not on file  ?Other Topics Concern  ? Not on file  ?Social History Narrative  ? Not on file  ? ?Social Determinants of Health  ? ?Financial Resource Strain: Not on file  ?Food Insecurity: Not on file  ?Transportation  Needs: Not on file  ?Physical Activity: Not on file  ?Stress: Not on file  ?Social Connections: Not on file  ?Intimate Partner Violence: Not on file  ?  ? ?Physical Exam  ? ?Vitals:  ? 04/19/21 0516  ?BP: 125/86  ?Pulse: 66  ?Resp: 18  ?Temp: 98.1 ?F (36.7 ?C)  ?SpO2: 97%  ?  ?CONSTITUTIONAL: Chronically ill-appearing, NAD ?NEURO/PSYCH: Somnolent, oriented to name, moves all extremities equally ?EYES:  eyes equal and reactive ?ENT/NECK:  no LAD, no JVD ?CARDIO: Regular rate, well-perfused, normal S1 and S2 ?PULM:  CTAB no wheezing or rhonchi ?GI/GU:  non-distended, non-tender ?MSK/SPINE:  No gross deformities, no edema ?SKIN:  no rash, atraumatic ? ? ?*Additional and/or pertinent findings included in MDM below ? ?Diagnostic and Interventional Summary  ? ? EKG Interpretation ? ?Date/Time:  Thursday April 19 2021 05:13:42 EDT ?Ventricular Rate:  65 ?PR Interval:  174 ?QRS Duration: 112 ?QT Interval:  442 ?QTC Calculation: 460 ?R Axis:   -9 ?Text Interpretation: Sinus rhythm Incomplete right bundle branch block Confirmed by Gerlene Fee [phone]) on 04/19/2021 6:39:47 AM ?  ? ?  ? ?Labs Reviewed  ?I-STAT CHEM 8, ED - Abnormal; Notable for the following components:  ?  Result Value  ? Calcium, Ion 1.06 (*)   ? All other components within normal limits  ?PROTIME-INR  ?APTT  ?CBC  ?DIFFERENTIAL  ?COMPREHENSIVE METABOLIC PANEL  ?CBG MONITORING, ED  ?  ?CT ANGIO HEAD NECK W WO CM W PERF (CODE STROKE)  ?Final Result  ?  ?CT HEAD CODE STROKE WO CONTRAST  ?Final Result  ?  ?  ?Medications  ?sodium chloride flush (NS) 0.9 % injection 3 mL (has no administration in time range)  ?iohexol (OMNIPAQUE) 350 MG/ML injection 100 mL (100 mLs Intravenous Contrast Given 04/19/21 0608)  ?  ? ?Procedures  /  Critical Care ?.Critical Care ?Performed by: Maudie Flakes, MD ?Authorized by: Maudie Flakes, MD  ? ?Critical care provider statement:  ?  Critical care time (minutes):  45 ?  Critical care was necessary to treat or prevent imminent  or life-threatening deterioration of the following conditions: Concern for acute ischemic stroke. ?  Critical care was time spent personally by me on the following activities:  Development of treatment plan with patient or surrogate, discussions with consultants, evaluation of patient's response to treatment, examination of patient, ordering and review of laboratory studies, ordering and review of radiographic studies, ordering and performing treatments and interventions, pulse oximetry, re-evaluation of patient's condition and review of old charts ? ?ED Course and Medical Decision Making  ?Initial Impression and Ddx ?Concern for stroke, however patient seems to have improved dramatically, moving all extremities equally.  Awaiting CT imaging, neuro recommendations. ? ?Past medical/surgical history that increases complexity of ED encounter: History of Parkinson's ? ?Interpretation of Diagnostics ?I personally reviewed the EKG and my interpretation is as follows: Sinus rhythm ?   ?Labs do not show any significant blood count or electrolyte disturbance.  CT imaging showing question of acute infarct. ? ?Patient Reassessment and Ultimate Disposition/Management ?On my repeat assessment patient is somnolent, altered, says nonsensical things.  Per neurology plan is for admission, MRI, EEG.  We will reach out to medicine. ? ?Patient management required discussion with the following services or consulting groups:  Hospitalist Service and Neurology ? ?Complexity of Problems Addressed ?Acute illness or injury that poses threat of life of bodily function ? ?Additional Data Reviewed and Analyzed ?Further history obtained from: ?EMS on arrival ? ?Additional Factors Impacting ED Encounter Risk ?Consideration of hospitalization ? ?Barth Kirks. Sedonia Small, MD ?[facility] Emergency Medicine ?[email or web address] ? ?Final Clinical Impressions(s) / ED Diagnoses  ? ?  ICD-10-CM   ?1. Altered mental status,  unspecified altered mental status type  R41.82   ?  ?  ?ED Discharge Orders   ? ? None  ? ?  ?  ? ?Discharge Instructions Discussed with and Provided to Patient:  ? ?Discharge Instructions   ?None ?  ? ?  ?Maudie Flakes, MD ?04/19/21 [phone] ? ?

## 2021-04-19 NOTE — ED Notes (Signed)
Daughter Marge Duncans [phone] would like an update asap ?

## 2021-04-19 NOTE — Code Documentation (Signed)
Stroke Response Nurse Documentation ?Code Documentation ? ?Courtney Reed is a 88 y.o. female arriving to [facility]  via [facility] on 4/13 with past medical hx of Parkinson's dz, hypothyroidism. On No antithrombotic. Code stroke was activated by EMS.  ? ?Patient from memory unit [facility] where she was LKW at 2000 4/12 and now complaining of aphasia and left sided weakness.  ? ?Stroke team at the bedside on patient arrival. Labs drawn and patient cleared for CT by Dr. Sedonia Small. Patient to CT with team. NIHSS 3, see documentation for details and code stroke times. Patient with disoriented and Global aphasia  on exam. The following imaging was completed:  CT Head. Patient is not a candidate for IV Thrombolytic due to out of 4.5 hour window.   ? ? ?Bedside handoff with ED RN .   ? ?Madelynn Done  ?Rapid Response RN ? ? ?

## 2021-04-19 NOTE — Procedures (Signed)
Patient Name: Courtney Reed [facility]  ?MRN: [id number]  ?Epilepsy Attending: Lora Havens  ?Referring Physician/Provider: Donnetta Simpers, MD ?Date: 04/19/2021 ?Duration: 22.47 mins ? ?Patient history: 88 year old female with dementia and Parkinson's disease who presents with altered mental status. EEG to evaluate for seizure ? ?Level of alertness: Awake, asleep ? ?AEDs during EEG study: None ? ?Technical aspects: This EEG study was done with scalp electrodes positioned according to the 10-20 International system of electrode placement. Electrical activity was acquired at a sampling rate of '500Hz'$  and reviewed with a high frequency filter of '70Hz'$  and a low frequency filter of '1Hz'$ . EEG data were recorded continuously and digitally stored.  ? ?Description: During awake state, no clear posterior dominant rhythm was noted.. Sleep was characterized by vertex waves, sleep spindles (12 to 14 Hz), maximal frontocentral region.  EEG showed continuous generalized predominantly 5 to 6 Hz theta slowing admixed with intermittent generalized 2 to 3 Hz delta slowing. Hyperventilation and photic stimulation were not performed.    ? ?ABNORMALITY ?- Continuous slow, generalized ? ?IMPRESSION: ?This study is suggestive of moderate diffuse encephalopathy, nonspecific etiology.  No seizures or epileptiform discharges were seen throughout the recording. ? ?Lora Havens  ? ?

## 2021-04-19 NOTE — ED Triage Notes (Signed)
Pt BIB [facility], called in as a code stroke. Per incode pt had L sided weakness, L sided facial droop, and aphasia. Upon assessment pt able to raise all extremities w/ no deficits, pt is altered w/ dementia at baseline. Pt did not comply well w/ neuro assessment and is combative.  ?

## 2021-04-19 NOTE — Assessment & Plan Note (Signed)
-  Patient presenting with encephalopathy reported by her nursing facility ?-Evaluation is generally unremarkable with normal labs (UA unlikely to be infection but culture was ordered by EDP), negative MRI, and EEG with generalized slowing ?-She is quite irritable and her daughter reports that hospitalization generally requires a prolonged period of time for normalization of her behavior ?-Based on back to presumed baseline in conjunction with unremarkable evaluation, she appears to be appropriate for dc back to her facility at this time ?

## 2021-04-19 NOTE — ED Notes (Signed)
Report given to Recas Paschell, LPN of [place] ?

## 2021-04-19 NOTE — Assessment & Plan Note (Addendum)
-  Continue Synthroid ?-TSH is mildly elevated; this will need to be followed up at her facility with possible medication adjustment. ?

## 2021-04-21 LAB — URINE CULTURE: Culture: 50000 — AB

## 2021-04-22 ENCOUNTER — Telehealth (HOSPITAL_BASED_OUTPATIENT_CLINIC_OR_DEPARTMENT_OTHER): Payer: Self-pay | Admitting: *Deleted

## 2021-04-22 NOTE — Telephone Encounter (Signed)
Post ED Visit - Positive Culture Follow-up ? ?Culture report reviewed by antimicrobial stewardship pharmacist: ?[place] Team ?'[]'$  Elenor Quinones, Pharm.D. ?'[]'$  Heide Guile, Pharm.D., BCPS AQ-ID ?'[]'$  Parks Neptune, Pharm.D., BCPS ?'[]'$  Alycia Rossetti, Pharm.D., BCPS ?'[]'$  [place], Pharm.D., BCPS, AAHIVP ?'[]'$  Legrand Como, Pharm.D., BCPS, AAHIVP ?'[]'$  Salome Arnt, PharmD, BCPS ?'[]'$  Johnnette Gourd, PharmD, BCPS ?'[]'$  Hughes Better, PharmD, BCPS ?'[]'$  Leeroy Cha, PharmD ?'[]'$  Laqueta Linden, PharmD, BCPS ?'[x]'$  Albertina Parr, PharmD ? ?[place] Team ?'[]'$  Leodis Sias, PharmD ?'[]'$  Lindell Spar, PharmD ?'[]'$  Royetta Asal, PharmD ?'[]'$  Graylin Shiver, Rph ?'[]'$  Rema Fendt) Glennon Mac, PharmD ?'[]'$  Arlyn Dunning, PharmD ?'[]'$  Netta Cedars, PharmD ?'[]'$  Dia Sitter, PharmD ?'[]'$  Leone Haven, PharmD ?'[]'$  Gretta Arab, PharmD ?'[]'$  Theodis Shove, PharmD ?'[]'$  Peggyann Juba, PharmD ?'[]'$  Reuel Boom, PharmD ? ? ?Positive urine culture ?No Treatment indicated per Dr. Dene Gentry ? ?Courtney Reed ?04/22/2021, 11:03 AM ?  ?

## 2021-04-22 NOTE — Progress Notes (Signed)
ED Antimicrobial Stewardship Positive Culture Follow Up  ? ?Courtney Reed is an 88 y.o. female who presented to [facility] on 04/19/2021 with a chief complaint of  ?Chief Complaint  ?Patient presents with  ? Code Stroke  ? ? ?Recent Results (from the past 720 hour(s))  ?Urine Culture     Status: Abnormal  ? Collection Time: 04/19/21 10:13 AM  ? Specimen: Urine, Clean Catch  ?Result Value Ref Range Status  ? Specimen Description URINE, CLEAN CATCH  Final  ? Special Requests   Final  ?  NONE ?Performed at [facility], [place] [address] [place], [place] [address] ?  ? Culture 50,000 COLONIES/mL KLEBSIELLA PNEUMONIAE (A)  Final  ? Report Status 04/21/2021 FINAL  Final  ? Organism ID, Bacteria KLEBSIELLA PNEUMONIAE (A)  Final  ?    Susceptibility  ? Klebsiella pneumoniae - MIC*  ?  AMPICILLIN >=32 RESISTANT Resistant   ?  CEFAZOLIN <=4 SENSITIVE Sensitive   ?  CEFEPIME <=0.12 SENSITIVE Sensitive   ?  CEFTRIAXONE <=0.25 SENSITIVE Sensitive   ?  CIPROFLOXACIN <=0.25 SENSITIVE Sensitive   ?  GENTAMICIN <=1 SENSITIVE Sensitive   ?  IMIPENEM <=0.25 SENSITIVE Sensitive   ?  NITROFURANTOIN 128 RESISTANT Resistant   ?  TRIMETH/SULFA <=20 SENSITIVE Sensitive   ?  AMPICILLIN/SULBACTAM 4 SENSITIVE Sensitive   ?  PIP/TAZO <=4 SENSITIVE Sensitive   ?  * 50,000 COLONIES/mL KLEBSIELLA PNEUMONIAE  ? ? ?Discussed case with MD. Bacteria colony count does not meet threshold for treatment. Likely asymptomatic bacteriuria. No treatment indicated  ? ? ?ED Provider: Dene Gentry, MD  ? ? ?Albertina Parr, PharmD., BCCCP ?Clinical Pharmacist ?Please refer to AMION for unit-specific pharmacist  ? ? ?

## 2021-09-18 ENCOUNTER — Emergency Department (HOSPITAL_BASED_OUTPATIENT_CLINIC_OR_DEPARTMENT_OTHER)
Admission: EM | Admit: 2021-09-18 | Discharge: 2021-09-19 | Disposition: A | Discharge status: Home / Self Care | Payer: Medicare Other | Attending: Emergency Medicine | Admitting: Emergency Medicine

## 2021-09-18 ENCOUNTER — Encounter (HOSPITAL_BASED_OUTPATIENT_CLINIC_OR_DEPARTMENT_OTHER): Payer: Self-pay | Admitting: Urology

## 2021-09-18 ENCOUNTER — Emergency Department (HOSPITAL_BASED_OUTPATIENT_CLINIC_OR_DEPARTMENT_OTHER): Payer: Medicare Other

## 2021-09-18 DIAGNOSIS — G20 Parkinson's disease: Secondary | ICD-10-CM | POA: Diagnosis not present

## 2021-09-18 DIAGNOSIS — W19XXXA Unspecified fall, initial encounter: Secondary | ICD-10-CM | POA: Insufficient documentation

## 2021-09-18 DIAGNOSIS — S0990XA Unspecified injury of head, initial encounter: Secondary | ICD-10-CM | POA: Diagnosis present

## 2021-09-18 DIAGNOSIS — S0101XA Laceration without foreign body of scalp, initial encounter: Secondary | ICD-10-CM | POA: Insufficient documentation

## 2021-09-18 DIAGNOSIS — Y92129 Unspecified place in nursing home as the place of occurrence of the external cause: Secondary | ICD-10-CM | POA: Diagnosis not present

## 2021-09-18 DIAGNOSIS — Z23 Encounter for immunization: Secondary | ICD-10-CM | POA: Diagnosis not present

## 2021-09-18 MED ORDER — LIDOCAINE HCL 2 % IJ SOLN
10.0000 mL | Freq: Once | INTRAMUSCULAR | Status: AC
  Administered 2021-09-18: 200 mg
  Filled 2021-09-18: qty 20

## 2021-09-18 MED ORDER — TETANUS-DIPHTH-ACELL PERTUSSIS 5-2.5-18.5 LF-MCG/0.5 IM SUSY
0.5000 mL | PREFILLED_SYRINGE | Freq: Once | INTRAMUSCULAR | Status: AC
  Administered 2021-09-18: 0.5 mL via INTRAMUSCULAR
  Filled 2021-09-18: qty 0.5

## 2021-09-18 NOTE — ED Provider Notes (Signed)
[facility] EMERGENCY DEPARTMENT Provider Note   CSN: [id number] Arrival date & time: 09/18/21  2147     History  Chief Complaint  Patient presents with   Fall   Laceration    Courtney Reed is a 88 y.o. female history of parkinson's, here presenting with fall.  Patient is wheelchair-bound at baseline and excellently fell out of her wheelchair and hit her head.  She had some bleeding to her scalp area.  Patient is not on blood thinners.  Patient was sent here from nursing home for evaluation.  Patient states that she did not pass out.  Denies any other injuries.  The history is provided by the patient.       Home Medications Prior to Admission medications   Medication Sig Start Date End Date Taking? Authorizing Provider  carbidopa-levodopa (SINEMET IR) 25-100 MG tablet TAKE ONE TABLET BY MOUTH THREE TIMES A DAY Patient taking differently: Take 1 tablet by mouth 3 (three) times daily. 10/04/19   Penumalli, Earlean Polka, MD  cholecalciferol (VITAMIN D) 25 MCG (1000 UNIT) tablet Take 1,000 Units by mouth every Monday, Wednesday, and Friday.    [provider]  estradiol (ESTRACE) 0.5 MG tablet Take 0.5 mg by mouth daily.    [provider]  ketoconazole (NIZORAL) 2 % cream Apply 1 application. topically daily.    [provider]  levothyroxine (SYNTHROID) 75 MCG tablet Take 75 mcg by mouth daily before breakfast.    [provider]  nystatin (MYCOSTATIN/NYSTOP) powder as needed. 01/28/18   [provider]  omeprazole (PRILOSEC OTC) 20 MG tablet Take 20 mg by mouth daily.    [provider]  rOPINIRole (REQUIP) 1 MG tablet Take 1 mg by mouth 3 (three) times daily.    [provider]  rOPINIRole (REQUIP) 2 MG tablet TAKE 1 TABLET (2 MG TOTAL) BY MOUTH THREE TIMES DAILY. Patient not taking: Reported on 04/19/2021 04/29/19   Penumalli, Earlean Polka, MD  sertraline (ZOLOFT) 50 MG tablet Take 50 mg by mouth daily. 03/10/19    [provider]  talc (ZEASORB) powder Apply 1 application. topically as needed (rash).    [provider]      Allergies    Sulfa antibiotics, Sulfacetamide sodium, and Sulfasalazine    Review of Systems   Review of Systems  Skin:  Positive for wound.  All other systems reviewed and are negative.   Physical Exam Updated Vital Signs BP (!) 144/73 (BP Location: Right Arm)   Pulse 65   Temp 98.2 F (36.8 C) (Oral)   Resp 18   Ht '5\' 2"'$  (1.575 m)   Wt 60 kg   SpO2 100%   BMI 24.19 kg/m  Physical Exam Vitals and nursing note reviewed.  Constitutional:      Appearance: Normal appearance.     Comments: Demented but at baseline  HENT:     Head: Normocephalic.     Comments: 2 cm laceration of the right scalp area.  There is some missing skin there.    Nose: Nose normal.     Mouth/Throat:     Mouth: Mucous membranes are moist.  Eyes:     Extraocular Movements: Extraocular movements intact.     Pupils: Pupils are equal, round, and reactive to light.  Cardiovascular:     Rate and Rhythm: Normal rate and regular rhythm.     Pulses: Normal pulses.     Heart sounds: Normal heart sounds.  Pulmonary:  Effort: Pulmonary effort is normal.     Breath sounds: Normal breath sounds.  Abdominal:     General: Abdomen is flat.     Palpations: Abdomen is soft.  Musculoskeletal:        General: Normal range of motion.     Cervical back: Normal range of motion and neck supple.     Comments: No obvious extremity trauma and no spinal tenderness  Skin:    General: Skin is warm.     Capillary Refill: Capillary refill takes less than 2 seconds.  Neurological:     General: No focal deficit present.     Comments: Demented, moving all extremities has baseline tremors  Psychiatric:        Mood and Affect: Mood normal.     ED Results / Procedures / Treatments   Labs (all labs ordered are listed, but only abnormal results are displayed) Labs Reviewed - No data to  display  EKG None  Radiology No results found.  Procedures Procedures    Medications Ordered in ED Medications  lidocaine (XYLOCAINE) 2 % (with pres) injection 200 mg (has no administration in time range)    ED Course/ Medical Decision Making/ A&P                           Medical Decision Making AHUVA POYNOR is a 88 y.o. female here presenting with scalp laceration after a fall.  Patient has history of Parkinson's with wheelchair-bound and had a mechanical fall.  Plan to get CT head and cervical spine.  Will suture laceration.  Will update Tdap.  10:51 PM CT head and cervical spine unremarkable.  The scalp wound was sutured by the resident.  Suture removal in a week.  Stable for discharge back to facility.  Problems Addressed: Scalp laceration, initial encounter: acute illness or injury  Amount and/or Complexity of Data Reviewed Radiology: ordered and independent interpretation performed. Decision-making details documented in ED Course.  Risk Prescription drug management.    Final Clinical Impression(s) / ED Diagnoses Final diagnoses:  None    Rx / DC Orders ED Discharge Orders     None         Drenda Freeze, MD 09/18/21 2252

## 2021-09-18 NOTE — ED Provider Notes (Signed)
LACERATION REPAIR Performed by: Teola Bradley Authorized by: Teola Bradley Consent: Verbal consent obtained. Risks and benefits: risks, benefits and alternatives were discussed Consent given by: patient Patient identity confirmed: provided demographic data Prepped and Draped in normal sterile fashion Wound explored  Laceration Location: Right temporal   Laceration Length: 1cm  No Foreign Bodies seen or palpated  Anesthesia: local infiltration  Local anesthetic: lidocaine 2% without epinephrine  Anesthetic total: 1 ml  Irrigation method: syringe Amount of cleaning: standard  Skin closure: 5'0 Ethilon   Number of sutures: 2  Technique: simple interrupted   Patient tolerance: Patient tolerated the procedure well with no immediate complications.     Teola Bradley, MD 09/18/21 2256    Drenda Freeze, MD 09/18/21 [phone]

## 2021-09-18 NOTE — ED Triage Notes (Addendum)
Per EMS, pt had mechanical fall at SNF, hit head on bed post Small lac noted to right side scalp, bleeding controlled Not on Thinners  Denies any other injuries with fall  Pt A&O at baseline with h/o dementia and wheelchair bound  DNR at bedside from SNF

## 2021-09-18 NOTE — Discharge Instructions (Signed)
Your CT scan did not show any bleeding or fractures today.  You have 2 sutures placed and that can be removed in 7 to 10 days.  Fall precautions  Return to ER if you have another fall, uncontrolled bleeding

## 2022-05-08 DEATH — deceased
